# Patient Record
Sex: Male | Born: 1990 | Race: White | Hispanic: No | Marital: Single | Smoking: Never smoker
Health system: Southern US, Community
[De-identification: ages and names within clinical notes are randomized; demographics above are authoritative.]

## PROBLEM LIST (undated history)

## (undated) DIAGNOSIS — J45990 Exercise induced bronchospasm: Secondary | ICD-10-CM

## (undated) DIAGNOSIS — J3501 Chronic tonsillitis: Secondary | ICD-10-CM

## (undated) DIAGNOSIS — M4310 Spondylolisthesis, site unspecified: Secondary | ICD-10-CM

## (undated) HISTORY — PX: TYMPANOSTOMY TUBE PLACEMENT: SHX32

---
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
## 2012-01-24 ENCOUNTER — Ambulatory Visit (INDEPENDENT_AMBULATORY_CARE_PROVIDER_SITE_OTHER): Payer: 59 | Admitting: Physician Assistant

## 2012-01-24 VITALS — BP 104/70 | HR 72 | Temp 97.8°F | Resp 16 | Ht 72.5 in | Wt 155.0 lb

## 2012-01-24 DIAGNOSIS — J069 Acute upper respiratory infection, unspecified: Secondary | ICD-10-CM

## 2012-01-24 DIAGNOSIS — T148XXA Other injury of unspecified body region, initial encounter: Secondary | ICD-10-CM

## 2012-01-24 DIAGNOSIS — IMO0002 Reserved for concepts with insufficient information to code with codable children: Secondary | ICD-10-CM

## 2012-01-24 MED ORDER — GUAIFENESIN ER 1200 MG PO TB12: 1.0000 | ORAL_TABLET | Freq: Two times a day (BID) | ORAL | Status: DC | PRN

## 2012-01-24 MED ORDER — IPRATROPIUM BROMIDE 0.03 % NA SOLN: 2.0000 | Freq: Two times a day (BID) | NASAL | Status: DC

## 2012-01-24 NOTE — Patient Instructions (Addendum)
Get lots of rest and drink at least 64 ounces of water daily. You can expect to see improvement in the next 48-72 hours.  If your symptoms worsen or persist, please return for re-evaluation.  Apply an antibiotic ointment to the abrasion under your arm.

## 2012-01-24 NOTE — Progress Notes (Signed)
  Subjective:    Patient ID: Jacob Bright, male    DOB: 1990-10-18, 21 y.o.   MRN: [id number]  HPI This 21 y.o. male presents for evaluation of congestion, sore throat, "I think due to drainage" and coughing.  Symptoms began about 5 days ago.  No fever, chills, GI symptoms, GU symptoms, unexplained myalgias/arthralgias.  Rash noted in right axilla 3 ago.  Not itchy, but feels burning sensation when raises arm over head.  No new products. He recalls resting his arms on the edge of the pool a few days ago, but does not recall scraping or burning himself at that time.  Review of Systems As above.  Past Medical History  Diagnosis Date  . Asthma     Past Surgical History  Procedure Date  . Nasal septum surgery   . Tympanostomy tube placement     Prior to Admission medications   Medication Sig Start Date End Date Taking? Authorizing Provider  albuterol (PROVENTIL HFA;VENTOLIN HFA) 108 (90 BASE) MCG/ACT inhaler Inhale 2 puffs into the lungs every 6 (six) hours as needed.   Yes Historical Provider, MD    No Known Allergies  History   Social History  . Marital Status: Single    Spouse Name: n/a    Number of Children: 0  . Years of Education: N/A   Occupational History  . Student     [employer]; Accounting   Social History Main Topics  . Smoking status: Never Smoker   . Smokeless tobacco: Current User    Types: Chew   Comment: doesn't use daily  . Alcohol Use: 2.0 - 9.0 oz/week    4-18 drink(s) per week  . Drug Use: No  . Sexually Active: Yes -- Male partner(s)   Other Topics Concern  . 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    Family History  Problem Relation Age of Onset  . Hypertension Father        Objective:   Physical Exam Blood pressure 104/70, pulse 72, temperature 97.8 F (36.6 C), temperature source Oral, resp. rate 16, height 6' 0.5" (1.842 m), weight 155 lb (70.308 kg), SpO2  97.00%. Body mass index is 20.73 kg/(m^2). Well-developed, well nourished WM who is awake, alert and oriented, in NAD. HEENT: [place]/AT, sclera and conjunctiva are clear.  EAC are patent, TMs are normal in appearance. Nasal mucosa is pink and moist. OP is clear. Neck: supple, non-tender, no lymphadenopathy, thyromegaly. Heart: RRR, no murmur Lungs: normal effort, CTA Skin: warm and dry with a crusted abrasion in the right axilla, without evidence of secondary infection.     Assessment & Plan:   1. Acute upper respiratory infections of unspecified site  Guaifenesin (MUCINEX MAXIMUM STRENGTH) 1200 MG TB12, ipratropium (ATROVENT) 0.03 % nasal spray  2. Abrasion  OTC antibiotic ointment   Anticipatory guidance provided.

## 2012-05-05 ENCOUNTER — Ambulatory Visit (INDEPENDENT_AMBULATORY_CARE_PROVIDER_SITE_OTHER): Payer: 59 | Admitting: Internal Medicine

## 2012-05-05 VITALS — BP 127/71 | HR 114 | Temp 98.6°F | Resp 18 | Ht 72.5 in | Wt 161.2 lb

## 2012-05-05 DIAGNOSIS — J039 Acute tonsillitis, unspecified: Secondary | ICD-10-CM

## 2012-05-05 DIAGNOSIS — J029 Acute pharyngitis, unspecified: Secondary | ICD-10-CM

## 2012-05-05 LAB — POCT CBC
Granulocyte percent: 82.6 %G — AB (ref 37–80)
HCT, POC: 48.3 % (ref 43.5–53.7)
Hemoglobin: 15.4 g/dL (ref 14.1–18.1)
Lymph, poc: 1 (ref 0.6–3.4)
MCH, POC: 30.2 pg (ref 27–31.2)
MCHC: 31.9 g/dL (ref 31.8–35.4)
MCV: 94.8 fL (ref 80–97)
MID (cbc): 0.8 (ref 0–0.9)
MPV: 9.5 fL (ref 0–99.8)
POC Granulocyte: 8.8 — AB (ref 2–6.9)
POC LYMPH PERCENT: 9.7 %L — AB (ref 10–50)
POC MID %: 7.7 %M (ref 0–12)
Platelet Count, POC: 237 10*3/uL (ref 142–424)
RBC: 5.1 M/uL (ref 4.69–6.13)
RDW, POC: 12.8 %
WBC: 10.7 10*3/uL — AB (ref 4.6–10.2)

## 2012-05-05 LAB — POCT RAPID STREP A (OFFICE): Rapid Strep A Screen: NEGATIVE

## 2012-05-05 MED ORDER — ACETAMINOPHEN-CODEINE #3 300-30 MG PO TABS: 1.0000 | ORAL_TABLET | Freq: Four times a day (QID) | ORAL | Status: DC | PRN

## 2012-05-05 MED ORDER — AMOXICILLIN 500 MG PO CAPS: 1000.0000 mg | ORAL_CAPSULE | Freq: Two times a day (BID) | ORAL | Status: AC

## 2012-05-05 NOTE — Progress Notes (Signed)
  Subjective:    Patient ID: Jacob Bright, male    DOB: 09-21-1990, 21 y.o.   MRN: [id number]  HPI mark throat pain since last night with swelling History of recurrent tonsillitis 3-5 times a year Rarely strep No history of mono Fever over night couldn't sleep   Review of Systems     Objective:   Physical Exam Vital signs stable Nares clear Tonsils 4+ an ulcerated with exudate 2+ a.c. nodes tender       Results for orders placed in visit on 05/05/12  POCT RAPID STREP A (OFFICE)      Component Value Range   Rapid Strep A Screen Negative  Negative  POCT CBC      Component Value Range   WBC 10.7 (*) 4.6 - 10.2 K/uL   Lymph, poc 1.0  0.6 - 3.4   POC LYMPH PERCENT 9.7 (*) 10 - 50 %L   MID (cbc) 0.8  0 - 0.9   POC MID % 7.7  0 - 12 %M   POC Granulocyte 8.8 (*) 2 - 6.9   Granulocyte percent 82.6 (*) 37 - 80 %G   RBC 5.10  4.69 - 6.13 M/uL   Hemoglobin 15.4  14.1 - 18.1 g/dL   HCT, POC 40.9  81.1 - 53.7 %   MCV 94.8  80 - 97 fL   MCH, POC 30.2  27 - 31.2 pg   MCHC 31.9  31.8 - 35.4 g/dL   RDW, POC 91.4     Platelet Count, POC 237  142 - 424 K/uL   MPV 9.5  0 - 99.8 fL    Assessment & Plan:  Problem #1 acute tonsillitis with history of recurrent problems  Throat culture Meds ordered this encounter  Medications  . amoxicillin (AMOXIL) 500 MG capsule    Sig: Take 2 capsules (1,000 mg total) by mouth 2 (two) times daily.    Dispense:  40 capsule    Refill:  0  . acetaminophen-codeine (TYLENOL #3) 300-30 MG per tablet    Sig: Take 1-2 tablets by mouth every 6 (six) hours as needed for pain.    Dispense:  30 tablet    Refill:  0   Consider tonsillectomy

## 2012-05-07 ENCOUNTER — Encounter: Payer: Self-pay | Admitting: Internal Medicine

## 2012-05-07 LAB — CULTURE, GROUP A STREP

## 2012-10-24 ENCOUNTER — Ambulatory Visit (INDEPENDENT_AMBULATORY_CARE_PROVIDER_SITE_OTHER): Payer: 59 | Admitting: Emergency Medicine

## 2012-10-24 VITALS — BP 136/80 | HR 97 | Temp 97.7°F | Resp 16 | Ht 73.5 in | Wt 171.0 lb

## 2012-10-24 DIAGNOSIS — R21 Rash and other nonspecific skin eruption: Secondary | ICD-10-CM

## 2012-10-24 DIAGNOSIS — J029 Acute pharyngitis, unspecified: Secondary | ICD-10-CM

## 2012-10-24 MED ORDER — PENICILLIN V POTASSIUM 500 MG PO TABS: 500.0000 mg | ORAL_TABLET | Freq: Four times a day (QID) | ORAL | Status: DC

## 2012-10-24 NOTE — Patient Instructions (Signed)
Herpes Simplex Herpes simplex is generally classified as Type 1 or Type 2. Type 1 is generally the type that is responsible for cold sores. Type 2 is generally associated with sexually transmitted diseases. We now know that most of the thoughts on these viruses are inaccurate. We find that HSV1 is also present genitally and HSV2 can be present orally, but this will vary in different locations of the world. Herpes simplex is usually detected by doing a culture. Blood tests are also available for this virus; however, the accuracy is often not as good.  PREPARATION FOR TEST No preparation or fasting is necessary. NORMAL FINDINGS  No virus present  No HSV antigens or antibodies present Ranges for normal findings may vary among different laboratories and hospitals. You should always check with your doctor after having lab work or other tests done to discuss the meaning of your test results and whether your values are considered within normal limits. MEANING OF TEST  Your caregiver will go over the test results with you and discuss the importance and meaning of your results, as well as treatment options and the need for additional tests if necessary. OBTAINING THE TEST RESULTS  It is your responsibility to obtain your test results. Ask the lab or department performing the test when and how you will get your results. Document Released: 06/10/2004 Document Revised: 07/31/2011 Document Reviewed: 04/18/2008 ExitCare Patient Information 2014 [employer].  

## 2012-10-24 NOTE — Progress Notes (Signed)
[facility] [address], [place] [address] [phone]  Date:  10/24/2012   Name:  Jacob Bright   DOB:  September 04, 1990   MRN:  [id number]  PCP:  No primary provider on file.    Chief Complaint: Sore Throat and Rash   History of Present Illness:  Jacob Bright is a 22 y.o. very pleasant male patient who presents with the following:  Ill with nasal congestion and drainage.  Has a mucoid discharge and post nasal drainage and a non productive cough.  No nausea or vomiting.  No stool change.  No fever or chills. No wheezing or shortness of breath. Developed a sore throat.  No sick contacts.   Had intercourse with a new partner yesterday and developed a painful rash on his right inguinal crease yesterday.  No history of STD.  No dysuria or penile lesions. No improvement with over the counter medications or other home remedies. Denies other complaint or health concern today.   There are no active problems to display for this patient.   Past Medical History  Diagnosis Date  . Asthma     Past Surgical History  Procedure Laterality Date  . Nasal septum surgery    . Tympanostomy tube placement      History  Substance Use Topics  . Smoking status: Never Smoker   . Smokeless tobacco: Current User    Types: Chew     Comment: doesn't use daily  . Alcohol Use: 2 - 9 oz/week    4-18 drink(s) per week     Comment: social    Family History  Problem Relation Age of Onset  . Hypertension Father     No Known Allergies  Medication list has been reviewed and updated.  Current Outpatient Prescriptions on File Prior to Visit  Medication Sig Dispense Refill  . albuterol (PROVENTIL HFA;VENTOLIN HFA) 108 (90 BASE) MCG/ACT inhaler Inhale 2 puffs into the lungs every 6 (six) hours as needed.      Marland Kitchen ipratropium (ATROVENT) 0.03 % nasal spray Place 2 sprays into the nose 2 (two) times daily.  30 mL  0  . acetaminophen-codeine (TYLENOL #3) 300-30 MG per tablet Take 1-2  tablets by mouth every 6 (six) hours as needed for pain.  30 tablet  0  . Guaifenesin (MUCINEX MAXIMUM STRENGTH) 1200 MG TB12 Take 1 tablet (1,200 mg total) by mouth every 12 (twelve) hours as needed.  14 tablet  1   No current facility-administered medications on file prior to visit.    Review of Systems:  As per HPI, otherwise negative.    Physical Examination: Filed Vitals:   10/24/12 1225  BP: 136/80  Pulse: 97  Temp: 97.7 F (36.5 C)  Resp: 16   Filed Vitals:   10/24/12 1225  Height: 6' 1.5" (1.867 m)  Weight: 171 lb (77.565 kg)   Body mass index is 22.25 kg/(m^2). Ideal Body Weight: Weight in (lb) to have BMI = 25: 191.7  GEN: WDWN, NAD, Non-toxic, A & O x 3 HEENT: Atraumatic, Normocephalic. Neck supple. No masses, No LAD. Oropharynx erythematous.  Tonsils enlarged. Ears and Nose: No external deformity. CV: RRR, No M/G/R. No JVD. No thrill. No extra heart sounds. PULM: CTA B, no wheezes, crackles, rhonchi. No retractions. No resp. distress. No accessory muscle use. ABD: S, NT, ND, +BS. No rebound. No HSM. EXTR: No c/c/e NEURO Normal gait.  PSYCH: Normally interactive. Conversant. Not depressed or anxious appearing.  Calm  demeanor.  Skin:  Discrete lesions decapitated.  Tender seem vesicular and crusted.  Right groin Genitalia normal male  Assessment and Plan: phayrngitis Pen vk Rash suggestive of herpes Culture  Signed,  Phillips Odor, MD

## 2012-10-28 LAB — HERPES SIMPLEX VIRUS CULTURE: Organism ID, Bacteria: NOT DETECTED

## 2013-02-20 ENCOUNTER — Other Ambulatory Visit: Payer: Self-pay | Admitting: Orthopedic Surgery

## 2013-02-20 DIAGNOSIS — M25571 Pain in right ankle and joints of right foot: Secondary | ICD-10-CM

## 2013-02-25 ENCOUNTER — Other Ambulatory Visit: Payer: 59

## 2013-02-27 ENCOUNTER — Ambulatory Visit
Admission: RE | Admit: 2013-02-27 | Discharge: 2013-02-27 | Disposition: A | Discharge status: Home / Self Care | Payer: 59 | Source: Ambulatory Visit | Attending: Orthopedic Surgery | Admitting: Orthopedic Surgery

## 2013-02-27 DIAGNOSIS — M25571 Pain in right ankle and joints of right foot: Secondary | ICD-10-CM

## 2013-03-13 ENCOUNTER — Encounter (HOSPITAL_COMMUNITY): Payer: Self-pay | Admitting: Pharmacy Technician

## 2013-03-13 ENCOUNTER — Encounter (HOSPITAL_COMMUNITY)
Admission: RE | Admit: 2013-03-13 | Discharge: 2013-03-13 | Disposition: A | Discharge status: Home / Self Care | Payer: 59 | Source: Ambulatory Visit | Attending: Orthopedic Surgery | Admitting: Orthopedic Surgery

## 2013-03-13 ENCOUNTER — Other Ambulatory Visit (HOSPITAL_COMMUNITY): Payer: Self-pay | Admitting: Orthopedic Surgery

## 2013-03-13 ENCOUNTER — Encounter (HOSPITAL_COMMUNITY): Payer: Self-pay

## 2013-03-13 LAB — CBC
HCT: 45.2 % (ref 39.0–52.0)
Hemoglobin: 15.7 g/dL (ref 13.0–17.0)
MCH: 31.8 pg (ref 26.0–34.0)
MCHC: 34.7 g/dL (ref 30.0–36.0)
MCV: 91.5 fL (ref 78.0–100.0)
Platelets: 247 10*3/uL (ref 150–400)
RBC: 4.94 MIL/uL (ref 4.22–5.81)
RDW: 12.3 % (ref 11.5–15.5)
WBC: 5 10*3/uL (ref 4.0–10.5)

## 2013-03-13 LAB — BASIC METABOLIC PANEL
BUN: 16 mg/dL (ref 6–23)
CO2: 29 mEq/L (ref 19–32)
Calcium: 9.8 mg/dL (ref 8.4–10.5)
Chloride: 102 mEq/L (ref 96–112)
Creatinine, Ser: 1.1 mg/dL (ref 0.50–1.35)
GFR calc Af Amer: >90 mL/min (ref >90)
GFR calc non Af Amer: >90 mL/min (ref >90)
Glucose, Bld: 60 mg/dL — ABNORMAL LOW (ref 70–99)
Potassium: 4.1 mEq/L (ref 3.5–5.1)
Sodium: 140 mEq/L (ref 135–145)

## 2013-03-13 MED ORDER — CEFAZOLIN SODIUM-DEXTROSE 2-3 GM-% IV SOLR
2.0000 g | INTRAVENOUS | Status: AC
  Administered 2013-03-14: 2 g via INTRAVENOUS
  Filled 2013-03-13: qty 50

## 2013-03-13 NOTE — Progress Notes (Signed)
Denies having a PCP Denies having a CXR or EKG. Denies having a stress test, echo, or card cath. Pt states he has asthma with sports, but no problems, and asthma is well managed.

## 2013-03-13 NOTE — Pre-Procedure Instructions (Signed)
Jacob Bright  03/13/2013   Your procedure is scheduled on:  03-14-13 @ 1115  Report to [facility]  2 * 3 at 0915 AM.  Call this number if you have problems the morning of surgery: [phone]   Remember:   Do not eat food or drink liquids after midnight.   Take these medicines the morning of surgery with A SIP OF WATER: Pain med if needed, Albuterol if needed   Do not wear jewelry, make-up or nail polish.  Do not wear lotions, powders, or perfumes. You may wear deodorant.  Do not shave 48 hours prior to surgery. Men may shave face and neck.  Do not bring valuables to the hospital.  [facility] is not responsible                  for any belongings or valuables.               Contacts, dentures or bridgework may not be worn into surgery.  Leave suitcase in the car. After surgery it may be brought to your room.  For patients admitted to the hospital, discharge time is determined by your                treatment team.               Patients discharged the day of surgery will not be allowed to drive  home.    Special Instructions: Shower using CHG 2 nights before surgery and the night before surgery.  If you shower the day of surgery use CHG.  Use special wash - you have one bottle of CHG for all showers.  You should use approximately 1/3 of the bottle for each shower.   Please read over the following fact sheets that you were given: Pain Booklet, Coughing and Deep Breathing and Surgical Site Infection Prevention

## 2013-03-14 ENCOUNTER — Encounter (HOSPITAL_COMMUNITY): Payer: Self-pay | Admitting: *Deleted

## 2013-03-14 ENCOUNTER — Encounter (HOSPITAL_COMMUNITY): Payer: 59 | Admitting: Anesthesiology

## 2013-03-14 ENCOUNTER — Encounter (HOSPITAL_COMMUNITY)
Admission: RE | Disposition: A | Discharge status: Home / Self Care | Payer: Self-pay | Source: Ambulatory Visit | Attending: Orthopedic Surgery

## 2013-03-14 ENCOUNTER — Ambulatory Visit (HOSPITAL_COMMUNITY): Payer: 59 | Admitting: Anesthesiology

## 2013-03-14 ENCOUNTER — Ambulatory Visit (HOSPITAL_COMMUNITY)
Admission: RE | Admit: 2013-03-14 | Discharge: 2013-03-14 | Disposition: A | Discharge status: Home / Self Care | Payer: 59 | Source: Ambulatory Visit | Attending: Orthopedic Surgery | Admitting: Orthopedic Surgery

## 2013-03-14 DIAGNOSIS — M25371 Other instability, right ankle: Secondary | ICD-10-CM

## 2013-03-14 DIAGNOSIS — M249 Joint derangement, unspecified: Secondary | ICD-10-CM | POA: Insufficient documentation

## 2013-03-14 DIAGNOSIS — M24176 Other articular cartilage disorders, unspecified foot: Secondary | ICD-10-CM | POA: Insufficient documentation

## 2013-03-14 HISTORY — PX: ANKLE ARTHROSCOPY WITH RECONSTRUCTION: SHX5583

## 2013-03-14 SURGERY — ARTHROSCOPY, ANKLE, WITH RECONSTRUCTION
Anesthesia: General | Site: Ankle | Laterality: Right | Wound class: Clean

## 2013-03-14 MED ORDER — ONDANSETRON HCL 4 MG/2ML IJ SOLN: 4.0000 mg | Freq: Once | INTRAMUSCULAR | Status: DC | PRN

## 2013-03-14 MED ORDER — HYDROCODONE-ACETAMINOPHEN 5-325 MG PO TABS
1.0000 | ORAL_TABLET | Freq: Once | ORAL | Status: AC
  Administered 2013-03-14: 1 via ORAL

## 2013-03-14 MED ORDER — ONDANSETRON HCL 4 MG/2ML IJ SOLN
INTRAMUSCULAR | Status: DC | PRN
  Administered 2013-03-14: 4 mg via INTRAVENOUS

## 2013-03-14 MED ORDER — LIDOCAINE HCL (CARDIAC) 20 MG/ML IV SOLN
INTRAVENOUS | Status: DC | PRN
  Administered 2013-03-14: 80 mg via INTRAVENOUS

## 2013-03-14 MED ORDER — LACTATED RINGERS IV SOLN
INTRAVENOUS | Status: DC | PRN
  Administered 2013-03-14: 11:00:00 via INTRAVENOUS

## 2013-03-14 MED ORDER — HYDROCODONE-ACETAMINOPHEN 5-325 MG PO TABS: 1.0000 | ORAL_TABLET | Freq: Once | ORAL | Status: DC

## 2013-03-14 MED ORDER — GLYCOPYRROLATE 0.2 MG/ML IJ SOLN
INTRAMUSCULAR | Status: DC | PRN
  Administered 2013-03-14: 0.2 mg via INTRAVENOUS

## 2013-03-14 MED ORDER — HYDROMORPHONE HCL PF 1 MG/ML IJ SOLN
0.2500 mg | INTRAMUSCULAR | Status: DC | PRN
  Administered 2013-03-14 (×2): 0.5 mg via INTRAVENOUS

## 2013-03-14 MED ORDER — LACTATED RINGERS IV SOLN
INTRAVENOUS | Status: DC
  Administered 2013-03-14: 50 mL/h via INTRAVENOUS

## 2013-03-14 MED ORDER — FENTANYL CITRATE 0.05 MG/ML IJ SOLN
INTRAMUSCULAR | Status: DC | PRN
  Administered 2013-03-14: 50 ug via INTRAVENOUS

## 2013-03-14 MED ORDER — BUPIVACAINE HCL (PF) 0.25 % IJ SOLN
INTRAMUSCULAR | Status: AC
  Filled 2013-03-14: qty 30

## 2013-03-14 MED ORDER — FENTANYL CITRATE 0.05 MG/ML IJ SOLN
INTRAMUSCULAR | Status: AC
  Administered 2013-03-14: 100 ug
  Filled 2013-03-14: qty 2

## 2013-03-14 MED ORDER — FENTANYL CITRATE 0.05 MG/ML IJ SOLN
INTRAMUSCULAR | Status: AC
  Filled 2013-03-14: qty 2

## 2013-03-14 MED ORDER — 0.9 % SODIUM CHLORIDE (POUR BTL) OPTIME
TOPICAL | Status: DC | PRN
  Administered 2013-03-14: 1000 mL

## 2013-03-14 MED ORDER — HYDROMORPHONE HCL PF 1 MG/ML IJ SOLN
INTRAMUSCULAR | Status: AC
  Filled 2013-03-14: qty 1

## 2013-03-14 MED ORDER — PROPOFOL 10 MG/ML IV BOLUS
INTRAVENOUS | Status: DC | PRN
  Administered 2013-03-14: 200 mg via INTRAVENOUS

## 2013-03-14 MED ORDER — MIDAZOLAM HCL 5 MG/5ML IJ SOLN
INTRAMUSCULAR | Status: DC | PRN
  Administered 2013-03-14: 1 mg via INTRAVENOUS

## 2013-03-14 MED ORDER — HYDROCODONE-ACETAMINOPHEN 5-325 MG PO TABS: 1.0000 | ORAL_TABLET | Freq: Four times a day (QID) | ORAL | Status: DC | PRN

## 2013-03-14 MED ORDER — HYDROCODONE-ACETAMINOPHEN 5-325 MG PO TABS
ORAL_TABLET | ORAL | Status: AC
  Filled 2013-03-14: qty 1

## 2013-03-14 MED ORDER — SODIUM CHLORIDE 0.9 % IR SOLN
Status: DC | PRN
  Administered 2013-03-14 (×2): 3000 mL

## 2013-03-14 SURGICAL SUPPLY — 53 items
BANDAGE GAUZE ELAST BULKY 4 IN (GAUZE/BANDAGES/DRESSINGS) ×2 IMPLANT
BLADE CUDA 5.5 (BLADE) IMPLANT
BLADE GREAT WHITE 4.2 (BLADE) ×2 IMPLANT
BLADE INCISOR PLUS 5.5 (BLADE) IMPLANT
BLADE SURG 15 STRL LF DISP TIS (BLADE) ×1 IMPLANT
BLADE SURG 15 STRL SS (BLADE) ×1
BNDG COHESIVE 6X5 TAN STRL LF (GAUZE/BANDAGES/DRESSINGS) ×2 IMPLANT
BUR OVAL 6.0 (BURR) IMPLANT
COVER SURGICAL LIGHT HANDLE (MISCELLANEOUS) ×2 IMPLANT
DRAPE ARTHROSCOPY W/POUCH 114 (DRAPES) ×2 IMPLANT
DRAPE C-ARM MINI 42X72 WSTRAPS (DRAPES) IMPLANT
DRAPE U-SHAPE 47X51 STRL (DRAPES) ×2 IMPLANT
DRSG EMULSION OIL 3X3 NADH (GAUZE/BANDAGES/DRESSINGS) ×2 IMPLANT
DRSG PAD ABDOMINAL 8X10 ST (GAUZE/BANDAGES/DRESSINGS) ×2 IMPLANT
DURAPREP 26ML APPLICATOR (WOUND CARE) ×2 IMPLANT
GLOVE BIOGEL PI IND STRL 6.5 (GLOVE) ×1 IMPLANT
GLOVE BIOGEL PI IND STRL 9 (GLOVE) ×1 IMPLANT
GLOVE BIOGEL PI INDICATOR 6.5 (GLOVE) ×1
GLOVE BIOGEL PI INDICATOR 9 (GLOVE) ×1
GLOVE SURG ORTHO 9.0 STRL STRW (GLOVE) ×2 IMPLANT
GLOVE SURG SS PI 6.5 STRL IVOR (GLOVE) ×2 IMPLANT
GOWN PREVENTION PLUS XLARGE (GOWN DISPOSABLE) ×2 IMPLANT
GOWN SRG XL XLNG 56XLVL 4 (GOWN DISPOSABLE) ×2 IMPLANT
GOWN STRL NON-REIN XL XLG LVL4 (GOWN DISPOSABLE) ×2
KIT BASIN OR (CUSTOM PROCEDURE TRAY) ×2 IMPLANT
KIT ROOM TURNOVER OR (KITS) ×2 IMPLANT
MANIFOLD NEPTUNE II (INSTRUMENTS) ×2 IMPLANT
NEEDLE 18GX1X1/2 (RX/OR ONLY) (NEEDLE) ×2 IMPLANT
PACK ARTHROSCOPY DSU (CUSTOM PROCEDURE TRAY) ×2 IMPLANT
PAD ARMBOARD 7.5X6 YLW CONV (MISCELLANEOUS) ×4 IMPLANT
PAD CAST 4YDX4 CTTN HI CHSV (CAST SUPPLIES) ×1 IMPLANT
PADDING CAST COTTON 4X4 STRL (CAST SUPPLIES) ×1
PENCIL BUTTON HOLSTER BLD 10FT (ELECTRODE) ×2 IMPLANT
SET ARTHROSCOPY TUBING (MISCELLANEOUS) ×1
SET ARTHROSCOPY TUBING LN (MISCELLANEOUS) ×1 IMPLANT
SPLINT PLASTER CAST XFAST 5X30 (CAST SUPPLIES) ×1 IMPLANT
SPLINT PLASTER XFAST SET 5X30 (CAST SUPPLIES) ×1
SPONGE GAUZE 4X4 12PLY (GAUZE/BANDAGES/DRESSINGS) ×2 IMPLANT
SPONGE LAP 4X18 X RAY DECT (DISPOSABLE) ×2 IMPLANT
SUCTION FRAZIER TIP 10 FR DISP (SUCTIONS) IMPLANT
SUT ETHILON 4 0 PS 2 18 (SUTURE) IMPLANT
SUT FIBERWIRE 2-0 18 17.9 3/8 (SUTURE) ×4
SUT MNCRL AB 3-0 PS2 18 (SUTURE) IMPLANT
SUT VIC AB 2-0 CT1 27 (SUTURE) ×1
SUT VIC AB 2-0 CT1 TAPERPNT 27 (SUTURE) ×1 IMPLANT
SUTURE FIBERWR 2-0 18 17.9 3/8 (SUTURE) ×2 IMPLANT
SYR 20CC LL (SYRINGE) ×2 IMPLANT
TOWEL OR 17X24 6PK STRL BLUE (TOWEL DISPOSABLE) ×2 IMPLANT
TOWEL OR 17X26 10 PK STRL BLUE (TOWEL DISPOSABLE) ×2 IMPLANT
WAND 30 DEG SABER W/CORD (SURGICAL WAND) IMPLANT
WAND 90 DEG TURBOVAC W/CORD (SURGICAL WAND) ×2 IMPLANT
WATER STERILE IRR 1000ML POUR (IV SOLUTION) ×2 IMPLANT
WRAP KNEE MAXI GEL POST OP (GAUZE/BANDAGES/DRESSINGS) IMPLANT

## 2013-03-14 NOTE — Anesthesia Procedure Notes (Signed)
Anesthesia Regional Block:  Popliteal block  Pre-Anesthetic Checklist: ,, timeout performed, Correct Patient, Correct Site, Correct Laterality, Correct Procedure, Correct Position, site marked, Risks and benefits discussed,  Surgical consent,  Pre-op evaluation,  At surgeon's request and post-op pain management  Laterality: Right  Prep: chloraprep and alcohol swabs       Needles:  Injection technique: Single-shot  Needle Type: Stimulator Needle - 80          Additional Needles:  Procedures: nerve stimulator Popliteal block  Nerve Stimulator or Paresthesia:  Response: 0.5 mA, 0.1 ms, 2 cm  Additional Responses:   Narrative:  Start time: 03/14/2013 11:00 AM End time: 03/14/2013 11:05 AM Injection made incrementally with aspirations every 5 mL.  Performed by: Personally  Anesthesiologist: Maren Beach MD  Additional Notes: Pt accepts procedure and risks. 15cc 0.5% Marcaine w/ epi w/o difficulty or discomfort. GES

## 2013-03-14 NOTE — Transfer of Care (Signed)
Immediate Anesthesia Transfer of Care Note  Patient: Jacob Bright  Procedure(s) Performed: Procedure(s) with comments: ANKLE ARTHROSCOPY WITH RECONSTRUCTION (Right) - Right Ankle Allegra Grana Reconstruction with Ankle Arthroscopy  Patient Location: PACU  Anesthesia Type:General  Level of Consciousness: awake, alert  and oriented  Airway & Oxygen Therapy: Patient Spontanous Breathing  Post-op Assessment: Report given to PACU RN  Post vital signs: Reviewed and stable  Complications: No apparent anesthesia complications

## 2013-03-14 NOTE — Op Note (Signed)
OPERATIVE REPORT  DATE OF SURGERY: 03/14/2013  PATIENT:  Jacob Bright,  22 y.o. 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 Patient has failed prolonged conservative therapy is unable to perform his sport and presents at this time for lateral ankle reconstruction and arthroscopic debridement of the ankle. Risks and benefits were discussed with the patient and his father including infection neurovascular injury pain recurrent instability need for additional surgery. Patient's and his father state they understand and wish to proceed at this time. Description of procedure patient was brought to the operating room and underwent a general anesthetic after a regional block. After adequate levels of anesthesia were obtained patient's right lower extremity was prepped using DuraPrep and draped into a sterile field. An anterior incision was made anterior and inferior to the distal fibula on the right. This was carried down to the anterior talofibular ligament this was incised there was a bony loose body that was attached to the origin and this was removed. The necrotic redundant tissue at the anterior talofibular ligament was excised and the ligament was reconstructed with 20 FiberWire. This was then reinforced with the extensor retinaculum. Anterior drawer was now stable. The wound was irrigated and the subcutaneous tissue was closed using 2-0 Vicryl. Attention was then focused on the  ankle. A anteromedial anterolateral portals were made over the ankle the skin was incised blunt dissection was carried down to capsule and blunt trochars were used to insert into the joint. Visualization showed significant synovitis with impingement. Patient underwent extensive debridement of the synovitis. He had good articular cartilage the medial and lateral gutters were cleansed the anterior joint was cleansed. After debridement the electrocautery wand was used for hemostasis. Instruments removed the portals were closed using 2-0 nylon. Anterior drawer test was again checked and this was stable with no anterior drawer. The wounds were covered with Adaptic orthopedic sponges ABDs dressing Kerlix Coban and a sugar tong splint was applied. Patient was extubated taken to the PACU in stable condition plan for discharge to home.  PLAN OF CARE: Discharge to home after PACU  PATIENT DISPOSITION:  PACU - hemodynamically stable.   Nadara Mustard, MD 03/14/2013 12:30 PM

## 2013-03-14 NOTE — Anesthesia Preprocedure Evaluation (Signed)
Anesthesia Evaluation  Patient identified by MRN, date of birth, ID band Patient awake    Reviewed: Allergy & Precautions, H&P , NPO status , Patient's Chart, lab work & pertinent test results  Airway       Dental   Pulmonary asthma ,          Cardiovascular     Neuro/Psych    GI/Hepatic   Endo/Other    Renal/GU      Musculoskeletal   Abdominal   Peds  Hematology   Anesthesia Other Findings   Reproductive/Obstetrics                           Anesthesia Physical Anesthesia Plan  ASA: I  Anesthesia Plan: General   Post-op Pain Management:    Induction: Intravenous  Airway Management Planned: LMA  Additional Equipment:   Intra-op Plan:   Post-operative Plan: Extubation in OR  Informed Consent: I have reviewed the patients History and Physical, chart, labs and discussed the procedure including the risks, benefits and alternatives for the proposed anesthesia with the patient or authorized representative who has indicated his/her understanding and acceptance.     Plan Discussed with: CRNA, Anesthesiologist and Surgeon  Anesthesia Plan Comments:         Anesthesia Quick Evaluation

## 2013-03-14 NOTE — H&P (Signed)
Jacob Bright is an 22 y.o. male.   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 He has a chronic instability at this time has failed prolonged conservative therapy and presents at this time for surgical intervention.  Past Medical History  Diagnosis Date  . Asthma     Past Surgical History  Procedure Laterality Date  . Nasal septum surgery    . Tympanostomy tube placement      Family History  Problem Relation Age of Onset  . Hypertension Father    Social History:  reports that he has never smoked. His smokeless tobacco use includes Chew. He reports that he drinks about 2.0 ounces of alcohol per week. He reports that he does not use illicit drugs.  Allergies: No Known Allergies  No prescriptions prior to admission    Results for orders placed during the hospital encounter of 03/13/13 (from the past 48 hour(s))  CBC     Status: None   Collection Time    03/13/13 11:23 AM      Result Value Range   WBC 5.0  4.0 - 10.5 K/uL   RBC 4.94  4.22 - 5.81 MIL/uL   Hemoglobin 15.7  13.0 - 17.0 g/dL   HCT 16.1  09.6 - 04.5 %   MCV 91.5  78.0 - 100.0 fL   MCH 31.8  26.0 - 34.0 pg   MCHC 34.7  30.0 - 36.0 g/dL   RDW 40.9  81.1 - 91.4 %   Platelets 247  150 - 400 K/uL  BASIC METABOLIC PANEL     Status: Abnormal   Collection Time    03/13/13 11:23 AM      Result Value Range   Sodium 140  135 - 145 mEq/L   Potassium 4.1  3.5 - 5.1 mEq/L   Chloride 102  96 - 112 mEq/L   CO2 29  19 - 32 mEq/L   Glucose, Bld 60 (*) 70 - 99 mg/dL   BUN 16  6 - 23 mg/dL   Creatinine, Ser 7.82  0.50 - 1.35 mg/dL   Calcium 9.8  8.4 - 95.6 mg/dL   GFR calc non Af Amer >90  >90 mL/min   GFR calc Af Amer >90  >90 mL/min   Comment: (NOTE)     The eGFR has been calculated using the CKD EPI equation.     This calculation has not been validated in all  clinical situations.     eGFR's persistently <90 mL/min signify possible Chronic Kidney     Disease.   No results found.  Review of Systems  All other systems reviewed and are negative.    There were no vitals taken for this visit. Physical Exam  On examination patient is a positive anterior drawer the right ankle. He has impingement symptoms with pain palpation of the anterior aspect of the ankle joint. Even with taping of the ankle he has a positive anterior drawer. 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  Plan: Will plan for reconstruction of the anterior talofibular ligament with the glue modified posterior reconstruction. Plan for ankle arthroscopy with debridement. Risks and benefits were discussed including infection neurovascular injury pain DVT need for additional surgery. Patient states he understands and wished to proceed at this time. This was discussed extensively with the patient's father as well.  Jacob Bright V  03/14/2013, 6:13 AM

## 2013-03-17 ENCOUNTER — Encounter (HOSPITAL_COMMUNITY): Payer: Self-pay | Admitting: Orthopedic Surgery

## 2013-04-03 NOTE — Anesthesia Postprocedure Evaluation (Signed)
  Anesthesia Post-op Note  Patient: Jacob Bright  Procedure(s) Performed: Procedure(s) with comments: ANKLE ARTHROSCOPY WITH RECONSTRUCTION (Right) - Right Ankle Gould Modified Brostrom Reconstruction with Ankle Arthroscopy  Patient discharged with no reported anesthetic complications

## 2013-06-12 ENCOUNTER — Encounter: Payer: Self-pay | Admitting: Family Medicine

## 2013-06-12 ENCOUNTER — Ambulatory Visit (INDEPENDENT_AMBULATORY_CARE_PROVIDER_SITE_OTHER): Payer: 59 | Admitting: Family Medicine

## 2013-06-12 VITALS — BP 110/62 | HR 74 | Ht 74.0 in | Wt 162.0 lb

## 2013-06-12 DIAGNOSIS — F909 Attention-deficit hyperactivity disorder, unspecified type: Secondary | ICD-10-CM

## 2013-06-12 DIAGNOSIS — F902 Attention-deficit hyperactivity disorder, combined type: Secondary | ICD-10-CM

## 2013-06-12 MED ORDER — AMPHETAMINE-DEXTROAMPHETAMINE 20 MG PO TABS: 20.0000 mg | ORAL_TABLET | Freq: Two times a day (BID) | ORAL | Status: DC

## 2013-06-12 NOTE — Progress Notes (Signed)
   Subjective:    Patient ID: Jacob Bright, male    DOB: January 22, 1991, 23 y.o.   MRN: [id number]  HPI He is here for consultation concerning ADHD. He has a history of difficulty with being hyper, easily distracted his entire life. He has done well in school until he got into college and is now noting difficulty with keeping focus in getting good grades. His GPA is in the 2.6 range. He was recently evaluated by [facility]. The evaluation was reviewed and does indeed show evidence of ADHD. His mother was also recently diagnosed with ADD and was given Adderall. He did try a 20 mg pill which he states helped him with his focus and allowed him to get his tasks done more efficiently. It lasts roughly 5 hours. He had no residual symptoms.  He does play men's soccer at [employer]. and is Dr. Jonny RuizJohn long a [phone] a refill [phone] the M.D. office 5  Review of Systems     Objective:   Physical Exam  alert and in no distress otherwise not examined       Assessment & Plan:  ADHD (attention deficit hyperactivity disorder), combined type - Plan: amphetamine-dextroamphetamine (ADDERALL) 20 MG tablet  I will give him twice a day dosing. Discussed use of medication for 5 hours versus 10 depending upon his work schedule. Cautioned him about letting other people know he has this medication in terms of giving this or even selling it to anyone. Appropriate paperwork was filled out since he does play soccer at [employer].

## 2013-06-13 ENCOUNTER — Telehealth: Payer: Self-pay | Admitting: Family Medicine

## 2013-06-14 NOTE — Telephone Encounter (Signed)
lm

## 2013-06-27 ENCOUNTER — Ambulatory Visit (INDEPENDENT_AMBULATORY_CARE_PROVIDER_SITE_OTHER): Payer: 59 | Admitting: Physician Assistant

## 2013-06-27 VITALS — BP 118/68 | HR 99 | Temp 98.3°F | Resp 17 | Ht 72.5 in | Wt 163.0 lb

## 2013-06-27 DIAGNOSIS — J039 Acute tonsillitis, unspecified: Secondary | ICD-10-CM

## 2013-06-27 DIAGNOSIS — J029 Acute pharyngitis, unspecified: Secondary | ICD-10-CM

## 2013-06-27 DIAGNOSIS — R509 Fever, unspecified: Secondary | ICD-10-CM

## 2013-06-27 LAB — POCT CBC
Granulocyte percent: 72 %G (ref 37–80)
HCT, POC: 48.2 % (ref 43.5–53.7)
Hemoglobin: 15.3 g/dL (ref 14.1–18.1)
Lymph, poc: 1.4 (ref 0.6–3.4)
MCH, POC: 31.1 pg (ref 27–31.2)
MCHC: 31.7 g/dL — AB (ref 31.8–35.4)
MCV: 97.9 fL — AB (ref 80–97)
MID (cbc): 0.6 (ref 0–0.9)
MPV: 9.8 fL (ref 0–99.8)
POC Granulocyte: 5 (ref 2–6.9)
POC LYMPH PERCENT: 20.1 %L (ref 10–50)
POC MID %: 7.9 %M (ref 0–12)
Platelet Count, POC: 223 10*3/uL (ref 142–424)
RBC: 4.92 M/uL (ref 4.69–6.13)
RDW, POC: 12.9 %
WBC: 7 10*3/uL (ref 4.6–10.2)

## 2013-06-27 LAB — POCT RAPID STREP A (OFFICE): Rapid Strep A Screen: NEGATIVE

## 2013-06-27 MED ORDER — AMOXICILLIN 875 MG PO TABS: 875.0000 mg | ORAL_TABLET | Freq: Two times a day (BID) | ORAL | Status: DC

## 2013-06-27 MED ORDER — ACETAMINOPHEN-CODEINE #3 300-30 MG PO TABS: 1.0000 | ORAL_TABLET | Freq: Four times a day (QID) | ORAL | Status: DC | PRN

## 2013-06-27 NOTE — Progress Notes (Signed)
Subjective:    Patient ID: Jacob Bright H Luz, male    DOB: May 11, 1991, 23 y.o.   MRN: [id number]  HPI Patient ID: Jacob Bright H Fiscus MRN: [id number], DOB: May 11, 1991, 22 y.o. Date of Encounter: 06/27/2013, 7:17 PM  Primary Physician: Carollee HerterLALONDE,JOHN CHARLES, MD  Chief Complaint:  Chief Complaint  Patient presents with  . Sore Throat  . Headache  . previous fever this am per patient    HPI: 23 y.o. male presents with a 1 day history of sore throat, fever, chills, and headache. He complains of mild congestion, sinus pressure, otalgia, and post nasal drip. States he woke up the previous night sweating and with chills. No cough or rhinorrhea. Sightly muffled hearing. His major complaint is a sore throat. No GI complaints. Able to swallow saliva, but hurts to do so. Decreased appetite secondary to sore throat. Roommate with mild URI 1 week ago. Plays soccer for [employer], but it is in the off season. Typically gets strep throat 3 times per year.    PMH: Past Medical History  Diagnosis Date  . Asthma      Home Meds: Prior to Admission medications   Medication Sig Start Date End Date Taking? Authorizing Provider  amphetamine-dextroamphetamine (ADDERALL) 20 MG tablet Take 1 tablet (20 mg total) by mouth 2 (two) times daily. 06/12/13  Yes Ronnald NianJohn C Lalonde, MD    Allergies: No Known Allergies  History   Social History  . Marital Status: Single    Spouse Name: n/a    Number of Children: 0  . Years of Education: N/A   Occupational History  . Student     [employer]; Accounting   Social History Main Topics  . Smoking status: Never Smoker   . Smokeless tobacco: Current User    Types: Chew     Comment: doesn't use daily  . Alcohol Use: 2 - 9 oz/week    4-18 drink(s) per week     Comment: social  . Drug Use: No  . Sexual Activity: Yes    Partners: Female   Other Topics Concern  . 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      Review of Systems  Constitutional: Positive for fever and chills. Negative for appetite change and fatigue.       Woke up sweating.   HENT: Positive for congestion, ear pain, hearing loss, postnasal drip, sinus pressure and sore throat. Negative for rhinorrhea and sneezing.   Respiratory: Negative for cough, shortness of breath and wheezing.   Gastrointestinal: Negative for nausea, vomiting and diarrhea.  Musculoskeletal: Positive for myalgias.  Neurological: Positive for headaches.       Generalized headache.        Objective:   Physical Exam  Physical Exam: Blood pressure 118/68, pulse 99, temperature 98.3 F (36.8 C), temperature source Oral, resp. rate 17, height 6' 0.5" (1.842 m), weight 163 lb (73.936 kg), SpO2 98.00%., Body mass index is 21.79 kg/(m^2). General: Well developed, well nourished, in no acute distress. Head: Normocephalic, atraumatic, eyes without discharge, sclera non-icteric, nares are patent. Bilateral auditory canals clear, TM's are without perforation, pearly grey with reflective cone of light bilaterally. No sinus TTP. Oral cavity moist, dentition normal. Posterior pharynx mildly erythematous. Tonsils 2+ bilaterally. No post nasal drip, peritonsillar abscess, or tonsillar exudate. Uvula midline.  Neck: Supple. No thyromegaly. Full ROM. Lymph nodes: less than 2 cm AC bilaterally. No PC lymph nodes.  Lungs:  Clear bilaterally to auscultation without wheezes, rales, or rhonchi. Breathing is unlabored. Heart: RRR with S1 S2. No murmurs, rubs, or gallops appreciated. Msk:  Strength and tone normal for age. Extremities: No clubbing or cyanosis. No edema. Neuro: Alert and oriented X 3. Moves all extremities spontaneously. CNII-XII grossly in tact. Psych:  Responds to questions appropriately with a normal affect.   Labs: Results for orders placed in visit on 06/27/13  POCT CBC      Result Value Range   WBC 7.0  4.6 - 10.2 K/uL   Lymph, poc 1.4  0.6 - 3.4   POC  LYMPH PERCENT 20.1  10 - 50 %L   MID (cbc) 0.6  0 - 0.9   POC MID % 7.9  0 - 12 %M   POC Granulocyte 5.0  2 - 6.9   Granulocyte percent 72.0  37 - 80 %G   RBC 4.92  4.69 - 6.13 M/uL   Hemoglobin 15.3  14.1 - 18.1 g/dL   HCT, POC 16.1  09.6 - 53.7 %   MCV 97.9 (*) 80 - 97 fL   MCH, POC 31.1  27 - 31.2 pg   MCHC 31.7 (*) 31.8 - 35.4 g/dL   RDW, POC 04.5     Platelet Count, POC 223  142 - 424 K/uL   MPV 9.8  0 - 99.8 fL  POCT RAPID STREP A (OFFICE)      Result Value Range   Rapid Strep A Screen Negative  Negative    Throat culture pending    Assessment & Plan:  23 year old male with tonsillitis  -Amoxicillin 875 mg 1 po bid #20 no RF -Tylenol # 1 po q 6 hours prn ST #30 no RF -New tooth brush -Await throat culture -Rest/fluids -RTC precautions   Eula Listen, MHS, PA-C [facility] [address] [place], [place] [address] [phone] [facility] 06/27/2013 7:18 PM

## 2013-06-30 ENCOUNTER — Telehealth: Payer: Self-pay

## 2013-06-30 LAB — CULTURE, GROUP A STREP: Organism ID, Bacteria: NORMAL

## 2013-06-30 NOTE — Telephone Encounter (Signed)
PT IS RETURNING OUR CALL FOR LAB RESULTS

## 2013-07-01 NOTE — Telephone Encounter (Signed)
See labs 

## 2013-07-14 ENCOUNTER — Telehealth: Payer: Self-pay | Admitting: Family Medicine

## 2013-07-14 DIAGNOSIS — F902 Attention-deficit hyperactivity disorder, combined type: Secondary | ICD-10-CM

## 2013-07-14 MED ORDER — AMPHETAMINE-DEXTROAMPHETAMINE 20 MG PO TABS: 20.0000 mg | ORAL_TABLET | Freq: Two times a day (BID) | ORAL | Status: DC

## 2013-07-14 NOTE — Telephone Encounter (Signed)
He is taking at 20 mg twice per day and doing quite well on this. It lasts roughly 5 hours. He has no side effects on the medication or withdrawal symptoms.

## 2013-07-14 NOTE — Telephone Encounter (Signed)
Pt request refill for Adderall and wants to pick up today.  Pt ph [phone]

## 2013-08-02 ENCOUNTER — Ambulatory Visit (INDEPENDENT_AMBULATORY_CARE_PROVIDER_SITE_OTHER): Payer: 59 | Admitting: Family Medicine

## 2013-08-02 ENCOUNTER — Ambulatory Visit: Payer: 59

## 2013-08-02 VITALS — BP 124/78 | HR 116 | Temp 98.0°F | Resp 20 | Ht 72.5 in | Wt 163.4 lb

## 2013-08-02 DIAGNOSIS — M79641 Pain in right hand: Secondary | ICD-10-CM

## 2013-08-02 DIAGNOSIS — M79643 Pain in unspecified hand: Secondary | ICD-10-CM

## 2013-08-02 DIAGNOSIS — M79609 Pain in unspecified limb: Secondary | ICD-10-CM

## 2013-08-02 DIAGNOSIS — S62329A Displaced fracture of shaft of unspecified metacarpal bone, initial encounter for closed fracture: Secondary | ICD-10-CM

## 2013-08-02 NOTE — Patient Instructions (Signed)
Take ibuprofen for pain  Apply ice this weekend multiple times daily  Check and find out which orthopedic group you need to see, and if you need a referral we can refer you to the hand specialist at that group if they have one.

## 2013-08-02 NOTE — Progress Notes (Signed)
Subjective: Patient was playing soccer and fell on his right hand. He thinks he fell under his body and twisted backward somehow. He is hurting from the middle of the fourth metacarpal down into the finger. He has good range of motion of the of the finger. It is a little swollen on the back of his hand is tender when he presses on the palmar surface of his hand.  Objective: Neurovascular intact. Can flex and extend fingers well. Very tender on the fourth metacarpal. Some swelling.  [facility] reading (PRIMARY) by  Dr. Alwyn RenHopper Fracture of shaft of fourth metacarpal, spiral  Assessment: Hand pain Fracture fourth metacarpal  Plan. Gutter splint He will check with his sports department as to which orthopedic group he should see. I advised him to see an orthopedic hand specialist. He can call back here for a referral.  Theora Gianottihelle Jeffrey PA applied splint

## 2013-09-25 ENCOUNTER — Ambulatory Visit (INDEPENDENT_AMBULATORY_CARE_PROVIDER_SITE_OTHER): Payer: 59 | Admitting: Family Medicine

## 2013-09-25 ENCOUNTER — Encounter: Payer: Self-pay | Admitting: Family Medicine

## 2013-09-25 VITALS — BP 150/92 | HR 80 | Wt 166.0 lb

## 2013-09-25 DIAGNOSIS — F902 Attention-deficit hyperactivity disorder, combined type: Secondary | ICD-10-CM

## 2013-09-25 DIAGNOSIS — F909 Attention-deficit hyperactivity disorder, unspecified type: Secondary | ICD-10-CM

## 2013-09-25 MED ORDER — AMPHETAMINE-DEXTROAMPHETAMINE 20 MG PO TABS: 20.0000 mg | ORAL_TABLET | Freq: Three times a day (TID) | ORAL | Status: DC

## 2013-09-25 MED ORDER — AMPHETAMINE-DEXTROAMPHETAMINE 20 MG PO TABS
20.0000 mg | ORAL_TABLET | Freq: Three times a day (TID) | ORAL | Status: DC
Start: 2013-10-26 — End: 2013-12-16

## 2013-09-25 MED ORDER — AMPHETAMINE-DEXTROAMPHETAMINE 20 MG PO TABS
20.0000 mg | ORAL_TABLET | Freq: Three times a day (TID) | ORAL | Status: DC
Start: 2013-11-25 — End: 2013-12-16

## 2013-09-25 NOTE — Progress Notes (Signed)
   Subjective:    Patient ID: Jacob Bright, male    DOB: 09/22/1990, 23 y.o.   MRN: [id number]  HPI He is here for an ADD check. He notes that he is much more him focused and organized, in fact he got his first 4.0. He is getting about 3-4 hours of benefit out of it. On occasion ,he takes it 3 times per day. He has no withdrawal symptoms. In fact he has actually gained some weight.   Review of Systems     Objective:   Physical Exam Jacob Bright and in no distress otherwise not examined       Assessment & Plan:  ADHD (attention deficit hyperactivity disorder), combined type - Plan: amphetamine-dextroamphetamine (ADDERALL) 20 MG tablet, amphetamine-dextroamphetamine (ADDERALL) 20 MG tablet, amphetamine-dextroamphetamine (ADDERALL) 20 MG tablet  he has a very good feel for using it once, twice or even 3 times a day based on need a regard to school as well as playing soccer.

## 2013-12-10 ENCOUNTER — Telehealth: Payer: Self-pay | Admitting: Family Medicine

## 2013-12-10 DIAGNOSIS — F902 Attention-deficit hyperactivity disorder, combined type: Secondary | ICD-10-CM

## 2013-12-10 NOTE — Telephone Encounter (Signed)
He was seen 5/7.   At that time it appears he got 3 scripts which should take him through 12/26/13. Thus, have him call back for new scripts next week when Dr. Susann GivensLalonde is back in town.

## 2013-12-10 NOTE — Telephone Encounter (Signed)
Pt needs RF Adderall, says he usually gets 3 months at a time.  Please call when ready

## 2013-12-12 NOTE — Telephone Encounter (Signed)
Informed pt JCL will be back on Monday & will send refill request to him.

## 2013-12-15 NOTE — Telephone Encounter (Signed)
He should have enough to last til next month

## 2013-12-16 ENCOUNTER — Telehealth: Payer: Self-pay | Admitting: Family Medicine

## 2013-12-16 MED ORDER — AMPHETAMINE-DEXTROAMPHETAMINE 20 MG PO TABS: 20.0000 mg | ORAL_TABLET | Freq: Three times a day (TID) | ORAL | Status: DC

## 2013-12-16 NOTE — Telephone Encounter (Signed)
Pt does have enough to last til 8/7 but needs to be able to pick up next months Rx's this week (OK post date) just won't be in town to pick up next week

## 2013-12-16 NOTE — Telephone Encounter (Signed)
Pt informed Adderall x 3 ready for pick up

## 2014-02-03 ENCOUNTER — Telehealth: Payer: Self-pay | Admitting: Family Medicine

## 2014-02-16 NOTE — Telephone Encounter (Signed)
fyi

## 2014-02-17 ENCOUNTER — Encounter: Payer: Self-pay | Admitting: Family Medicine

## 2014-02-17 NOTE — Progress Notes (Signed)
   Subjective:    Patient ID: Jacob Bright, male    DOB: 09-01-1990, 23 y.o.   MRN: [id number]  HPI    Review of Systems     Objective:   Physical Exam        Assessment & Plan:  He called on September 15 requesting a referral to Evette CristalKenneth Frazier. I called him back and apparently he has been having difficulty with anxiety, panic attacks. I decided to try and get him in with sports psychology however found out that he was also having other related difficulties requiring more than a sports psychologist felt comfortable with. His father made a recommendation that he see a different therapist. An appointment was set up. I recommended that he allow the therapist to talk to me. He relates anxiety with potentially re\re injuring himself and apparently has had panic attacks with rapid shortness of breath as well as a hyperventilation type symptoms. As yet, I have not talked to the therapist. He apparently decided to take time off from soccer. I encouraged him to return to soccer with the idea that he would be more beneficial for him to continue to play rather than given to his anxieties. Apparently there are questions about his use of drugs. He also apparently made a comment about potentially hurting someone while playing. I will need to followup on this before I can allow him to return to playing soccer.

## 2014-02-17 NOTE — Progress Notes (Unsigned)
   Subjective:    Patient ID: Jacob Bright, male    DOB: Jan 18, 1991, 23 y.o.   MRN: [id number]  HPI    Review of Systems     Objective:   Physical Exam        Assessment & Plan:

## 2014-02-25 ENCOUNTER — Ambulatory Visit (INDEPENDENT_AMBULATORY_CARE_PROVIDER_SITE_OTHER): Payer: 59 | Admitting: Physician Assistant

## 2014-02-25 VITALS — BP 118/72 | HR 92 | Temp 98.1°F | Resp 16 | Ht 72.5 in | Wt 159.2 lb

## 2014-02-25 DIAGNOSIS — J029 Acute pharyngitis, unspecified: Secondary | ICD-10-CM

## 2014-02-25 LAB — POCT RAPID STREP A (OFFICE): Rapid Strep A Screen: NEGATIVE

## 2014-02-25 MED ORDER — GUAIFENESIN ER 1200 MG PO TB12: 1.0000 | ORAL_TABLET | Freq: Two times a day (BID) | ORAL | Status: DC | PRN

## 2014-02-25 MED ORDER — IPRATROPIUM BROMIDE 0.03 % NA SOLN: 2.0000 | Freq: Two times a day (BID) | NASAL | Status: DC

## 2014-02-25 MED ORDER — ACETAMINOPHEN-CODEINE #2 300-15 MG PO TABS: 1.0000 | ORAL_TABLET | ORAL | Status: DC | PRN

## 2014-02-25 NOTE — Progress Notes (Signed)
I have examined this patient along with Ms. Bush, PA-C and agree.  

## 2014-02-25 NOTE — Patient Instructions (Signed)
Drink plenty fluids and get plenty of rest. We will let you know if your throat culture grew out anything. Please return to clinic if your symptoms do not improve.  Pharyngitis Pharyngitis is redness, pain, and swelling (inflammation) of your pharynx.  CAUSES  Pharyngitis is usually caused by infection. Most of the time, these infections are from viruses (viral) and are part of a cold. However, sometimes pharyngitis is caused by bacteria (bacterial). Pharyngitis can also be caused by allergies. Viral pharyngitis may be spread from person to person by coughing, sneezing, and personal items or utensils (cups, forks, spoons, toothbrushes). Bacterial pharyngitis may be spread from person to person by more intimate contact, such as kissing.  SIGNS AND SYMPTOMS  Symptoms of pharyngitis include:   Sore throat.   Tiredness (fatigue).   Low-grade fever.   Headache.  Joint pain and muscle aches.  Skin rashes.  Swollen lymph nodes.  Plaque-like film on throat or tonsils (often seen with bacterial pharyngitis). DIAGNOSIS  Your health care provider will ask you questions about your illness and your symptoms. Your medical history, along with a physical exam, is often all that is needed to diagnose pharyngitis. Sometimes, a rapid strep test is done. Other lab tests may also be done, depending on the suspected cause.  TREATMENT  Viral pharyngitis will usually get better in 3-4 days without the use of medicine. Bacterial pharyngitis is treated with medicines that kill germs (antibiotics).  HOME CARE INSTRUCTIONS   Drink enough water and fluids to keep your urine clear or pale yellow.   Only take over-the-counter or prescription medicines as directed by your health care provider:   If you are prescribed antibiotics, make sure you finish them even if you start to feel better.   Do not take aspirin.   Get lots of rest.   Gargle with 8 oz of salt water ( tsp of salt per 1 qt of water) as  often as every 1-2 hours to soothe your throat.   Throat lozenges (if you are not at risk for choking) or sprays may be used to soothe your throat. SEEK MEDICAL CARE IF:   You have large, tender lumps in your neck.  You have a rash.  You cough up green, yellow-brown, or bloody spit. SEEK IMMEDIATE MEDICAL CARE IF:   Your neck becomes stiff.  You drool or are unable to swallow liquids.  You vomit or are unable to keep medicines or liquids down.  You have severe pain that does not go away with the use of recommended medicines.  You have trouble breathing (not caused by a stuffy nose). MAKE SURE YOU:   Understand these instructions.  Will watch your condition.  Will get help right away if you are not doing well or get worse. Document Released: 05/08/2005 Document Revised: 02/26/2013 Document Reviewed: 01/13/2013 [facility] Patient Information 2015 [employer] [place]. This information is not intended to replace advice given to you by your health care provider. Make sure you discuss any questions you have with your health care provider.

## 2014-02-25 NOTE — Progress Notes (Signed)
   Subjective:    Patient ID: Jacob Bright,Jacob Bright male    DOB: 1990/06/10, 23 y.o.   MRN: [id number]  Sore Throat  Associated symptoms include congestion, coughing and trouble swallowing. Pertinent negatives include no drooling.  Fever  Associated symptoms include congestion, coughing and a sore throat.  Cough Associated symptoms include chills, a fever and a sore throat.    This is a 23 year old male here with sore throat x 2 days. He has felt feverish and has had chills. He also has a cough and nasal congestion. He reports it is extremely painful to swallow solids but is able to swallow liquids. He has been taking ibuprofen 400 mg every 5 hours. He reports he gets tonsillitis approximately three time a year. He wants to get a tonsillectomy however he plays soccer in college and doesn't feel he has time for surgery now.   Review of Systems  Constitutional: Positive for fever and chills.  HENT: Positive for congestion, sore throat and trouble swallowing. Negative for drooling.   Eyes: Negative for discharge.  Respiratory: Positive for cough.   Cardiovascular: Negative.   Gastrointestinal: Negative.       Objective:   Physical Exam  Constitutional: He is oriented to person, place, and time. He appears well-developed and well-nourished. No distress.  HENT:  Head: Normocephalic and atraumatic.  Right Ear: External ear normal.  Left Ear: External ear normal.  Bilateral tympanic membranes clear. Oropharynx erythematous, tonsils are 1+, no exudate present.  Neck: Neck supple.  Cardiovascular: Normal rate, regular rhythm, normal heart sounds and intact distal pulses.   No murmur heard. Pulmonary/Chest: Effort normal and breath sounds normal. No respiratory distress. He has no wheezes. He has no rales.  Lymphadenopathy:    He has no cervical adenopathy.  Neurological: He is alert and oriented to person, place, and time.  Skin: Skin is warm and dry. No rash noted.  Psychiatric: He has a normal  mood and affect. His behavior is normal. Thought content normal.   Results for orders placed in visit on 02/25/14  POCT RAPID STREP A (OFFICE)      Result Value Ref Range   Rapid Strep A Screen Negative  Negative      Assessment & Plan:  1. Acute pharyngitis, unspecified pharyngitis type  Patient has tonsillitis multiple times a year. He is usually prescribed amoxicillin and tylenol #3. His last rapid strep and throat culture here was negative. Today his rapid strep was negative. Discussed with the patient that this is likely a viral URI like last time. He was prescribed Mucinex, atrovent nasal spray and Tylenol #3. Culture is pending. He will be contacted and put on an antibiotic if culture grows bacteria.  - POCT rapid strep A - acetaminophen-codeine (TYLENOL #2) 300-15 MG per tablet; Take 1 tablet by mouth every 4 (four) hours as needed for moderate pain.  Dispense: 30 tablet; Refill: 0 - Culture, Group A Strep - ipratropium (ATROVENT) 0.03 % nasal spray; Place 2 sprays into both nostrils 2 (two) times daily.  Dispense: 30 mL; Refill: 0 - Guaifenesin (MUCINEX MAXIMUM STRENGTH) 1200 MG TB12; Take 1 tablet (1,200 mg total) by mouth every 12 (twelve) hours as needed.  Dispense: 14 tablet; Refill: 1

## 2014-02-27 LAB — CULTURE, GROUP A STREP

## 2014-02-28 ENCOUNTER — Other Ambulatory Visit: Payer: Self-pay | Admitting: Physician Assistant

## 2014-02-28 DIAGNOSIS — J020 Streptococcal pharyngitis: Secondary | ICD-10-CM

## 2014-02-28 MED ORDER — AMOXICILLIN 875 MG PO TABS: 875.0000 mg | ORAL_TABLET | Freq: Two times a day (BID) | ORAL | Status: AC

## 2014-03-01 ENCOUNTER — Telehealth: Payer: Self-pay

## 2014-03-01 DIAGNOSIS — J029 Acute pharyngitis, unspecified: Secondary | ICD-10-CM

## 2014-03-01 MED ORDER — ACETAMINOPHEN-CODEINE #2 300-15 MG PO TABS: 1.0000 | ORAL_TABLET | ORAL | Status: DC | PRN

## 2014-03-01 NOTE — Telephone Encounter (Signed)
Pt's throat is still very sore. Is on his way to the pharm now to p/u his rx. Wants to know if we can send in more Tylenol #3 for him. It is still very difficult for him to swallow and eat. Thanks

## 2014-03-01 NOTE — Telephone Encounter (Signed)
Prescription ready

## 2014-03-01 NOTE — Telephone Encounter (Signed)
Faxed and pt notified.

## 2014-03-10 ENCOUNTER — Institutional Professional Consult (permissible substitution): Payer: 59 | Admitting: Family Medicine

## 2014-03-12 ENCOUNTER — Telehealth: Payer: Self-pay | Admitting: Family Medicine

## 2014-03-12 NOTE — Telephone Encounter (Signed)
He called and asked for a refill on his Adderall. He recently had another drug screen done and it was positive for cocaine as well as amphetamines and codeine. He was apparently given codeine for a dental problem. He plans to potentially get involved in your inpatient or outpatient rehabilitation.

## 2014-03-16 ENCOUNTER — Telehealth: Payer: Self-pay | Admitting: Internal Medicine

## 2014-03-16 NOTE — Telephone Encounter (Signed)
He called stating that he recently finished a court case for DUI. He will lose his license for 1 year. He is also now involved in counseling through [facility] to help deal with the marijuana/cocaine issue. He will no longer be playing soccer at [employer]. He states that he wants now focuses energies on getting his life back together. I stated that I would need to be able to be in contact with his therapist at [facility] especially in regard to drug testing and the fact that he is on an ADD medication. I explained that I would be reluctant to give him this medication if he continues to use street drugs. He is to have his therapist signed a release form and come by here and sign one so we can communicate.

## 2014-03-16 NOTE — Telephone Encounter (Signed)
Pt called to let you know he has set up appts with other doctors and wants you to call him to talk about this

## 2014-03-23 ENCOUNTER — Telehealth: Payer: Self-pay | Admitting: Family Medicine

## 2014-03-23 NOTE — Telephone Encounter (Signed)
Psycholtherapist Jarvis MorganBill McCarthy [phone] called and request that you give him a call .

## 2014-03-24 NOTE — Telephone Encounter (Signed)
I received a call from Jarvis MorganBill McCarthy concerning Roe Coombson. Roe CoombsDon is apparently also going to become involved in [facility] to deal with his drug related issues. I expressed my concern with giving him Adderall under the circumstances. Roe CoombsDon is supposed to fill out a release form so the therapist there and discuss his care with me. Annette StableBill also trying get a release form so all this can be in communication.

## 2014-04-14 ENCOUNTER — Ambulatory Visit (INDEPENDENT_AMBULATORY_CARE_PROVIDER_SITE_OTHER): Payer: 59 | Admitting: Family Medicine

## 2014-04-14 ENCOUNTER — Encounter: Payer: Self-pay | Admitting: Family Medicine

## 2014-04-14 VITALS — BP 100/70 | HR 99 | Wt 157.0 lb

## 2014-04-14 DIAGNOSIS — F902 Attention-deficit hyperactivity disorder, combined type: Secondary | ICD-10-CM

## 2014-04-14 MED ORDER — AMPHETAMINE-DEXTROAMPHETAMINE 20 MG PO TABS: 20.0000 mg | ORAL_TABLET | Freq: Three times a day (TID) | ORAL | Status: DC

## 2014-04-14 NOTE — Progress Notes (Signed)
   Subjective:    Patient ID: Jacob Bright, male    DOB: 1990/12/20, 23 y.o.   MRN: [id number]  HPI He is here for consult. He has had difficulty recently with drugs but seems to have made good progress in dealing with this. He apparently was involved in an outpatient program but according to him they stated that he did not need to be involved in this. He continues in counseling with Jarvis MorganBill McCarthy. He is going to withdraw from some of his classes but others he will try to stay in. He would like to get that on his Adderall medication. He states that it does help him stay focused. He states that while on the medication his grades were in the 80s and prior to this he did have grades in the 2.5 range.   Review of Systems     Objective:   Physical Exam Alert and in no distress otherwise not examined       Assessment & Plan:  ADHD (attention deficit hyperactivity disorder), combined type - Plan: amphetamine-dextroamphetamine (ADDERALL) 20 MG tablet  I will place him back on the Adderall. Discussed continuing in counseling with Jarvis MorganBill McCarthy. I will also discuss this with him. He does seem to be very focused on getting his life in better order. He will now also be paying for his own education and living expenses.

## 2014-06-09 ENCOUNTER — Telehealth: Payer: Self-pay | Admitting: Family Medicine

## 2014-06-09 DIAGNOSIS — F902 Attention-deficit hyperactivity disorder, combined type: Secondary | ICD-10-CM

## 2014-06-09 MED ORDER — AMPHETAMINE-DEXTROAMPHETAMINE 20 MG PO TABS: 20.0000 mg | ORAL_TABLET | Freq: Three times a day (TID) | ORAL | Status: DC

## 2014-06-09 NOTE — Telephone Encounter (Signed)
Pt called and requested refill on adderall. Please call [phone] when ready.

## 2014-07-09 ENCOUNTER — Telehealth: Payer: Self-pay | Admitting: Family Medicine

## 2014-07-09 DIAGNOSIS — F902 Attention-deficit hyperactivity disorder, combined type: Secondary | ICD-10-CM

## 2014-07-09 MED ORDER — AMPHETAMINE-DEXTROAMPHETAMINE 20 MG PO TABS: 20.0000 mg | ORAL_TABLET | Freq: Three times a day (TID) | ORAL | Status: DC

## 2014-07-09 NOTE — Telephone Encounter (Signed)
Adderall renewed.

## 2014-07-10 ENCOUNTER — Telehealth: Payer: Self-pay | Admitting: Family Medicine

## 2014-07-13 NOTE — Telephone Encounter (Signed)
P.A. Approved Amphetamine/Dextroamphetamine til 07/11/15, pt informed, faxed pharmacy

## 2014-08-11 ENCOUNTER — Ambulatory Visit (INDEPENDENT_AMBULATORY_CARE_PROVIDER_SITE_OTHER): Payer: 59 | Admitting: Sports Medicine

## 2014-08-11 VITALS — BP 132/92 | HR 112 | Temp 98.1°F | Resp 20 | Ht 72.25 in | Wt 168.4 lb

## 2014-08-11 DIAGNOSIS — M545 Low back pain: Secondary | ICD-10-CM

## 2014-08-11 DIAGNOSIS — M5459 Other low back pain: Secondary | ICD-10-CM

## 2014-08-11 MED ORDER — PREDNISONE 50 MG PO TABS: 50.0000 mg | ORAL_TABLET | Freq: Every day | ORAL | Status: DC

## 2014-08-11 MED ORDER — CYCLOBENZAPRINE HCL 10 MG PO TABS: 10.0000 mg | ORAL_TABLET | Freq: Three times a day (TID) | ORAL | Status: DC | PRN

## 2014-08-11 MED ORDER — MELOXICAM 15 MG PO TABS: 15.0000 mg | ORAL_TABLET | Freq: Every day | ORAL | Status: DC

## 2014-08-11 NOTE — Progress Notes (Signed)
  Jacob Bright - 24 y.o. male MRN [id number]  Date of birth: January 28, 1991  SUBJECTIVE: Chief Complaint  Patient presents with  . Back Pain    Hurt Back lifting stone pallettes today    HPI:  Acute onset of left low back pain back pain last Thursday  Improve slowly over the weekend but then worsened again today while at work  Left-sided paraspinal pain, no midline pain, nonradiating.  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     ROS:  denies nausea, vomiting, changes in bowel or bladder No numbness or tingling in bilateral lower extremities Otherwise per history of present illness    HISTORY:  Past Medical, Surgical, Social, and Family History reviewed & updated per EMR.  Pertinent Historical Findings include:  reports that he has never smoked. His smokeless tobacco use includes Chew.  ADHD, followed by Dr. Susann GivensLalonde Social history: Was on [employer] team however is taking time away from school right now due to prior substance abuse issues. Patient was not as forthcoming with this during the visit but upon further chart review was noted these had polysubstance misuse/abuse as recently been in counseling for this.  OBJECTIVE:  VS:   HT:6' 0.25" (183.5 cm)   WT:168 lb 6 oz (76.374 kg)  BMI:22.7          BP:(!) 132/92 mmHg  HR:(!) 112bpm  TEMP:98.1 F (36.7 C)(Oral)  RESP:98 %  Physical Exam  Young adult Caucasian male, no acute distress he is alert and appropriately interactive. BACK: Guarding, bilateral paraspinal spinal muscle spasms with erect spine. Left greater than right muscle spasm and tenderness. TTP over left QL. Bilateral negative straight leg raise. Sensation and strength and L2-S1 distribution packed. Lower extremity Reflexes 1+/4 diffusely. Limited  lumbar rotation, sidebending and flexion extension. ASIS compression test positive on left.  ASSESSMENT: 1. Acute mechanical low back pain with duration of less than six weeks    history of substance abuse, ADHD  PLAN: See problem based charting & AVS for additional documentation.  Anti-inflammatory, steroid dose pack and Flexeril.  Avoid use of narcotics  Home exercises program recommended  Patient will try to follow-up with [facility] as he is established there.  Recommend follow-up with Dr. Susann GivensLalonde for routine health maintenance and monitoring for ADHD/substance abuse. > No Follow-up on file.

## 2014-08-11 NOTE — Progress Notes (Deleted)
  Jacob Bright - 24 y.o. male MRN [id number]  Date of birth: 11/05/1990  SUBJECTIVE: Chief Complaint  Patient presents with  . Back Pain    Hurt Back lifting stone pallettes today    HPI:  {01}     ROS: {02}   HISTORY:  Past Medical, Surgical, Social, and Family History reviewed & updated per EMR.  Pertinent Historical Findings include:  reports that he has never smoked. His smokeless tobacco use includes Chew. {03}   Historical Data Reviewed: {04}   OBJECTIVE:  VS:   HT:6' 0.25" (183.5 cm)   WT:168 lb 6 oz (76.374 kg)  BMI:22.7          BP:(!) 132/92 mmHg  HR:(!) 112bpm  TEMP:98.1 F (36.7 C)(Oral)  RESP:98 %  Physical Exam   DATA OBTAINED DURING VISIT: Results for orders placed or performed in visit on 02/25/14  Culture, Group A Strep  Result Value Ref Range   Organism ID, Bacteria STREPTOCOCCUS BETA HEMOLYTIC NOT GROUP A   POCT rapid strep A  Result Value Ref Range   Rapid Strep A Screen Negative Negative    {08}  ASSESSMENT: No diagnosis found.  {10}  PLAN: See problem based charting & AVS for additional documentation.  {07} > No Follow-up on file.   No orders of the defined types were placed in this encounter.    No orders of the defined types were placed in this encounter.

## 2014-08-11 NOTE — Patient Instructions (Signed)
Try taking the steroid dose pack and start the meloxicam once done Flexeril as needed Heat, ice and gentle ROM If you have any problems getting in to [name] please let me know. Tell Jacob Bright you saw Dr. Berline Bright  Back Pain, Adult Low back pain is very common. About 1 in 5 people have back pain.The cause of low back pain is rarely dangerous. The pain often gets better over time.About half of people with a sudden onset of back pain feel better in just 2 weeks. About 8 in 10 people feel better by 6 weeks.  CAUSES Some common causes of back pain include:  Strain of the muscles or ligaments supporting the spine.  Wear and tear (degeneration) of the spinal discs.  Arthritis.  Direct injury to the back. DIAGNOSIS Most of the time, the direct cause of low back pain is not known.However, back pain can be treated effectively even when the exact cause of the pain is unknown.Answering your caregiver's questions about your overall health and symptoms is one of the most accurate ways to make sure the cause of your pain is not dangerous. If your caregiver needs more information, he or she may order lab work or imaging tests (X-rays or MRIs).However, even if imaging tests show changes in your back, this usually does not require surgery. HOME CARE INSTRUCTIONS For many people, back pain returns.Since low back pain is rarely dangerous, it is often a condition that people can learn to [facility] their own.   Remain active. It is stressful on the back to sit or stand in one place. Do not sit, drive, or stand in one place for more than 30 minutes at a time. Take short walks on level surfaces as soon as pain allows.Try to increase the length of time you walk each day.  Do not stay in bed.Resting more than 1 or 2 days can delay your recovery.  Do not avoid exercise or work.Your body is made to move.It is not dangerous to be active, even though your back may hurt.Your back will likely heal faster  if you return to being active before your pain is gone.  Pay attention to your body when you bend and lift. Many people have less discomfortwhen lifting if they bend their knees, keep the load close to their bodies,and avoid twisting. Often, the most comfortable positions are those that put less stress on your recovering back.  Find a comfortable position to sleep. Use a firm mattress and lie on your side with your knees slightly bent. If you lie on your back, put a pillow under your knees.  Only take over-the-counter or prescription medicines as directed by your caregiver. Over-the-counter medicines to reduce pain and inflammation are often the most helpful.Your caregiver may prescribe muscle relaxant drugs.These medicines help dull your pain so you can more quickly return to your normal activities and healthy exercise.  Put ice on the injured area.  Put ice in a plastic bag.  Place a towel between your skin and the bag.  Leave the ice on for 15-20 minutes, 03-04 times a day for the first 2 to 3 days. After that, ice and heat may be alternated to reduce pain and spasms.  Ask your caregiver about trying back exercises and gentle massage. This may be of some benefit.  Avoid feeling anxious or stressed.Stress increases muscle tension and can worsen back pain.It is important to recognize when you are anxious or stressed and learn ways to manage it.Exercise is a  great option. SEEK MEDICAL CARE IF:  You have pain that is not relieved with rest or medicine.  You have pain that does not improve in 1 week.  You have new symptoms.  You are generally not feeling well. SEEK IMMEDIATE MEDICAL CARE IF:   You have pain that radiates from your back into your legs.  You develop new bowel or bladder control problems.  You have unusual weakness or numbness in your arms or legs.  You develop nausea or vomiting.  You develop abdominal pain.  You feel faint. Document Released: 05/08/2005  Document Revised: 11/07/2011 Document Reviewed: 09/09/2013 Jacob Bright Patient Information 2015 Jacob Bright. This information is not intended to replace advice given to you by your health care provider. Make sure you discuss any questions you have with your health care provider.

## 2014-08-13 DIAGNOSIS — M5459 Other low back pain: Secondary | ICD-10-CM | POA: Insufficient documentation

## 2014-08-13 DIAGNOSIS — M545 Low back pain: Secondary | ICD-10-CM | POA: Insufficient documentation

## 2014-09-01 ENCOUNTER — Ambulatory Visit (INDEPENDENT_AMBULATORY_CARE_PROVIDER_SITE_OTHER): Payer: 59 | Admitting: Family Medicine

## 2014-09-01 ENCOUNTER — Encounter: Payer: Self-pay | Admitting: Family Medicine

## 2014-09-01 VITALS — BP 112/80 | HR 106 | Wt 176.0 lb

## 2014-09-01 DIAGNOSIS — S39012D Strain of muscle, fascia and tendon of lower back, subsequent encounter: Secondary | ICD-10-CM

## 2014-09-01 MED ORDER — HYDROCODONE-ACETAMINOPHEN 5-325 MG PO TABS: 1.0000 | ORAL_TABLET | Freq: Four times a day (QID) | ORAL | Status: DC | PRN

## 2014-09-01 NOTE — Progress Notes (Signed)
   Subjective:    Patient ID: Jacob Bright, male    DOB: [birth year], 24 y.o.   MRN: [id number]  HPI He is here with a 3 week history of non radiating left low back pain that began after an injury that occurred while lifting a heavy palette of stones. States the pain is sharp and worse with movement. Denies numbness, tingling, and weakness to lower extremities, bowel or bladder dysfunction.  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 He went to an Urgent Care following his back injury and was prescribed Prednisone, Meloxicam, and Flexeril. He reports the pain is not any better and he has not been able to work the past 3 days. He has been taking Vicodin that he had left over from a previous injury and states this is the only medication that gives him relief. He states he has seen [facility] in past for a hip issue and is planning on following up with them.   Review of Systems     Objective:   Physical Exam Alert and in no distress. Left sided paraspinal tenderness and midline tenderness. Back full ROM, pain with extension, lateral bending, and lumbar rotation. Bilateral lower extremities normal sensation, strength, DTRs lower extremities normal, negative straight leg raise.        Assessment & Plan:  Back strain, subsequent encounter  Recommend using heat 20 minutes 3 times per day and stretching exercises such as knee to chest and take Norco as needed for back pain. Discussed using good posture and body mechanics when lifting. Follow up with [facility] as scheduled.  I explained that I didn't think his present back issue was related to previous difficulties with his hip. He would get a two-week break in the near future which I think will help with his mechanical low back pain.

## 2014-09-01 NOTE — Patient Instructions (Signed)
Heat for 20 minutes 3 times per day and stretching after that. Follow-up with orthopedics.

## 2014-09-07 ENCOUNTER — Telehealth: Payer: Self-pay | Admitting: Internal Medicine

## 2014-09-07 MED ORDER — HYDROCODONE-ACETAMINOPHEN 7.5-325 MG PO TABS: ORAL_TABLET | ORAL | Status: DC

## 2014-09-07 NOTE — Telephone Encounter (Signed)
Pt would like you to call him about his medicine for his back. He wants to talk to you about what is working best for him

## 2014-09-07 NOTE — Telephone Encounter (Signed)
He states that he is roughly 25% better. He is using 2 Norco twice per day to get through the day and also using meloxicam and Flexeril at night. I will switch him to the Norco 7.5. Explained to him that I would like him to use less and less of the medication.

## 2014-09-20 DIAGNOSIS — J3501 Chronic tonsillitis: Secondary | ICD-10-CM

## 2014-09-20 HISTORY — DX: Chronic tonsillitis: J35.01

## 2014-09-21 ENCOUNTER — Telehealth: Payer: Self-pay | Admitting: Family Medicine

## 2014-09-21 DIAGNOSIS — F902 Attention-deficit hyperactivity disorder, combined type: Secondary | ICD-10-CM

## 2014-09-21 MED ORDER — AMPHETAMINE-DEXTROAMPHETAMINE 20 MG PO TABS: 20.0000 mg | ORAL_TABLET | Freq: Three times a day (TID) | ORAL | Status: DC

## 2014-09-21 NOTE — Telephone Encounter (Signed)
Pt called for refill of adderall. Please call [phone] when ready.

## 2014-09-21 NOTE — Telephone Encounter (Signed)
Advised pt Rx Adderall ready for pick up

## 2014-09-22 ENCOUNTER — Telehealth: Payer: Self-pay | Admitting: Family Medicine

## 2014-09-22 MED ORDER — HYDROCODONE-ACETAMINOPHEN 7.5-325 MG PO TABS: ORAL_TABLET | ORAL | Status: DC

## 2014-09-22 NOTE — Telephone Encounter (Signed)
Pt asked that you call him in regards to a medication

## 2014-09-22 NOTE — Telephone Encounter (Signed)
He still needs a little bit of codeine to help get through the workday doing a lot of heavy labor. I will give him 20 more but warned him that I would not be able to continue this.

## 2014-09-29 ENCOUNTER — Ambulatory Visit (INDEPENDENT_AMBULATORY_CARE_PROVIDER_SITE_OTHER): Payer: 59 | Admitting: Emergency Medicine

## 2014-09-29 VITALS — BP 130/86 | HR 91 | Temp 97.5°F | Resp 15 | Ht 72.0 in | Wt 167.0 lb

## 2014-09-29 DIAGNOSIS — J0140 Acute pansinusitis, unspecified: Secondary | ICD-10-CM

## 2014-09-29 MED ORDER — PENICILLIN V POTASSIUM 500 MG PO TABS: 500.0000 mg | ORAL_TABLET | Freq: Four times a day (QID) | ORAL | Status: DC

## 2014-09-29 MED ORDER — PSEUDOEPHEDRINE-GUAIFENESIN ER 60-600 MG PO TB12: 1.0000 | ORAL_TABLET | Freq: Two times a day (BID) | ORAL | Status: DC

## 2014-09-29 MED ORDER — AMOXICILLIN-POT CLAVULANATE 875-125 MG PO TABS: 1.0000 | ORAL_TABLET | Freq: Two times a day (BID) | ORAL | Status: DC

## 2014-09-29 NOTE — Progress Notes (Signed)
[facility] [address], [place] [address] [phone]  Date:  09/29/2014   Name:  Jacob Bright Schommer   DOB:  April 23, 1991   MRN:  [id number]  PCP:  Carollee HerterLALONDE,JOHN CHARLES, MD    Chief Complaint: Sore Throat and Cough   History of Present Illness:  Jacob Bright Prew is a 24 y.o. very pleasant male patient who presents with the following:  Ill for several days with sore throat and dysphagia History of 5 prior infections past 12 months Scheduled for T&A Fever Malaise and fatigue. Nasal congestion and purulent drainage Non productive cough No wheezing or shortness of breath Scant hemoptysis Denies other complaint or health concern today.   Patient Active Problem List   Diagnosis Date Noted  . Acute mechanical low back pain with duration of less than six weeks 08/13/2014  . ADHD (attention deficit hyperactivity disorder), combined type 06/12/2013    Past Medical History  Diagnosis Date  . Asthma     Past Surgical History  Procedure Laterality Date  . Nasal septum surgery    . Tympanostomy tube placement    . Ankle arthroscopy with reconstruction Right 03/14/2013    Procedure: ANKLE ARTHROSCOPY WITH RECONSTRUCTION;  Surgeon: Nadara MustardMarcus V Duda, MD;  Location: [facility] OR;  Service: Orthopedics;  Laterality: Right;  Right Ankle Allegra GranaGould Modified Brostrom Reconstruction with Ankle Arthroscopy    History  Substance Use Topics  . Smoking status: Never Smoker   . Smokeless tobacco: Current User    Types: Chew     Comment: doesn't use daily  . Alcohol Use: 2.0 - 9.0 oz/week    4-18 drink(s) per week     Comment: social    Family History  Problem Relation Age of Onset  . Hypertension Father     No Known Allergies  Medication list has been reviewed and updated.  Current Outpatient Prescriptions on File Prior to Visit  Medication Sig Dispense Refill  . amphetamine-dextroamphetamine (ADDERALL) 20 MG tablet Take 1 tablet (20 mg total) by mouth 3 (three) times daily.  90 tablet 0   No current facility-administered medications on file prior to visit.    Review of Systems:  Review of Systems  Constitutional: Negative for fever, chills and fatigue.  HENT: Negative for congestion, ear pain, hearing loss, postnasal drip, rhinorrhea and sinus pressure.   Eyes: Negative for discharge and redness.  Respiratory: Negative for cough, shortness of breath and wheezing.   Cardiovascular: Negative for chest pain and leg swelling.  Gastrointestinal: Negative for nausea, vomiting, abdominal pain, constipation and blood in stool.  Genitourinary: Negative for dysuria, urgency and frequency.  Musculoskeletal: Negative for neck stiffness.  Skin: Negative for rash.  Neurological: Negative for seizures, weakness and headaches.   Physical Examination: Filed Vitals:   09/29/14 0929  BP: 130/86  Pulse: 91  Temp: 97.5 F (36.4 C)  Resp: 15   Filed Vitals:   09/29/14 0929  Height: 6' (1.829 m)  Weight: 167 lb (75.751 kg)   Body mass index is 22.64 kg/(m^2). Ideal Body Weight: Weight in (lb) to have BMI = 25: 183.9  GEN: WDWN, NAD, Non-toxic, A & O x 3 HEENT: Atraumatic, Normocephalic. Neck supple. No masses, No LAD. Ears and Nose: No external deformity. CV: RRR, No M/G/R. No JVD. No thrill. No extra heart sounds. PULM: CTA B, no wheezes, crackles, rhonchi. No retractions. No resp. distress. No accessory muscle use. ABD: S, NT, ND, +BS. No rebound. No HSM. EXTR: No c/c/e NEURO  Normal gait.  PSYCH: Normally interactive. Conversant. Not depressed or anxious appearing.  Calm demeanor.    Assessment and Plan: Acute sinusitis augmentin mucinex   Signed Phillips OdorJeffery English Tomer, MD

## 2014-10-02 ENCOUNTER — Telehealth: Payer: Self-pay

## 2014-10-02 NOTE — Telephone Encounter (Signed)
Pt has a medical history of tonsillitis; he gets infections every two months. He saw Dr. Dareen PianoAnderson 09/29/14 who diagnosed him with a sinus infection. However, the pt says he is noticing white spots on his tonsils and he is coughing up blood. The antibiotic that was prescribed for a sinus infection is not doing him any good, therefore he would like a different Rx. He also has not been able to eat for the past two days because his throat is so sore. He would like an Rx for the pain, please.

## 2014-10-02 NOTE — Telephone Encounter (Signed)
Pt called again. Please see last phone message.

## 2014-10-03 NOTE — Telephone Encounter (Signed)
Looks like pt is due to get his tonsils out on 6/6. Do we need him to RTC for his symptoms?

## 2014-10-03 NOTE — Telephone Encounter (Signed)
He wants us to know he is usually prescribed amoxicillin and tylenol #3. He feels that he has been here enough that we should know what this is and he doesn't have a way to come back in. I told him it is not in our medical practice to prescribe abx without evaluating first. Pt wanted me to let this be known anyway.

## 2014-10-03 NOTE — Telephone Encounter (Signed)
If patient is having new or worsening sx, he should come back in. I do not see any indication in Dr. Ewell PoeAnderson's note that would indicate tonsillitis.

## 2014-10-20 ENCOUNTER — Encounter (HOSPITAL_BASED_OUTPATIENT_CLINIC_OR_DEPARTMENT_OTHER): Payer: Self-pay | Admitting: *Deleted

## 2014-10-26 ENCOUNTER — Ambulatory Visit (HOSPITAL_BASED_OUTPATIENT_CLINIC_OR_DEPARTMENT_OTHER): Payer: 59 | Admitting: Anesthesiology

## 2014-10-26 ENCOUNTER — Ambulatory Visit (HOSPITAL_BASED_OUTPATIENT_CLINIC_OR_DEPARTMENT_OTHER)
Admission: RE | Admit: 2014-10-26 | Discharge: 2014-10-26 | Disposition: A | Discharge status: Home / Self Care | Payer: 59 | Source: Ambulatory Visit | Attending: Otolaryngology | Admitting: Otolaryngology

## 2014-10-26 ENCOUNTER — Encounter (HOSPITAL_BASED_OUTPATIENT_CLINIC_OR_DEPARTMENT_OTHER)
Admission: RE | Disposition: A | Discharge status: Home / Self Care | Payer: Self-pay | Source: Ambulatory Visit | Attending: Otolaryngology

## 2014-10-26 ENCOUNTER — Encounter (HOSPITAL_BASED_OUTPATIENT_CLINIC_OR_DEPARTMENT_OTHER): Payer: Self-pay | Admitting: *Deleted

## 2014-10-26 DIAGNOSIS — J3501 Chronic tonsillitis: Secondary | ICD-10-CM | POA: Diagnosis present

## 2014-10-26 DIAGNOSIS — Z885 Allergy status to narcotic agent status: Secondary | ICD-10-CM | POA: Insufficient documentation

## 2014-10-26 DIAGNOSIS — F17220 Nicotine dependence, chewing tobacco, uncomplicated: Secondary | ICD-10-CM | POA: Diagnosis not present

## 2014-10-26 DIAGNOSIS — J45990 Exercise induced bronchospasm: Secondary | ICD-10-CM | POA: Insufficient documentation

## 2014-10-26 HISTORY — DX: Exercise induced bronchospasm: J45.990

## 2014-10-26 HISTORY — DX: Spondylolisthesis, site unspecified: M43.10

## 2014-10-26 HISTORY — DX: Chronic tonsillitis: J35.01

## 2014-10-26 HISTORY — PX: TONSILLECTOMY: SHX5217

## 2014-10-26 LAB — POCT HEMOGLOBIN-HEMACUE: Hemoglobin: 17.1 g/dL — ABNORMAL HIGH (ref 13.0–17.0)

## 2014-10-26 SURGERY — TONSILLECTOMY
Anesthesia: General

## 2014-10-26 MED ORDER — LACTATED RINGERS IV SOLN
INTRAVENOUS | Status: DC
  Administered 2014-10-26: 10:00:00 via INTRAVENOUS

## 2014-10-26 MED ORDER — BACITRACIN ZINC 500 UNIT/GM EX OINT
TOPICAL_OINTMENT | CUTANEOUS | Status: AC
  Filled 2014-10-26: qty 0.9

## 2014-10-26 MED ORDER — LIDOCAINE HCL (CARDIAC) 20 MG/ML IV SOLN
INTRAVENOUS | Status: DC | PRN
Start: 2014-10-26 — End: 2014-10-26
  Administered 2014-10-26: 50 mg via INTRAVENOUS

## 2014-10-26 MED ORDER — MIDAZOLAM HCL 2 MG/2ML IJ SOLN
INTRAMUSCULAR | Status: AC
  Filled 2014-10-26: qty 2

## 2014-10-26 MED ORDER — PROMETHAZINE HCL 25 MG PO TABS: 25.0000 mg | ORAL_TABLET | Freq: Four times a day (QID) | ORAL | Status: DC | PRN

## 2014-10-26 MED ORDER — FENTANYL CITRATE (PF) 100 MCG/2ML IJ SOLN
INTRAMUSCULAR | Status: AC
  Filled 2014-10-26: qty 6

## 2014-10-26 MED ORDER — KCL IN DEXTROSE-NACL 20-5-0.45 MEQ/L-%-% IV SOLN
INTRAVENOUS | Status: DC
  Administered 2014-10-26: 13:00:00 via INTRAVENOUS
  Filled 2014-10-26: qty 1000

## 2014-10-26 MED ORDER — HYDROMORPHONE HCL 1 MG/ML IJ SOLN
INTRAMUSCULAR | Status: AC
  Filled 2014-10-26: qty 1

## 2014-10-26 MED ORDER — OXYCODONE HCL 5 MG PO TABS: 5.0000 mg | ORAL_TABLET | Freq: Once | ORAL | Status: DC | PRN

## 2014-10-26 MED ORDER — 0.9 % SODIUM CHLORIDE (POUR BTL) OPTIME
TOPICAL | Status: DC | PRN
  Administered 2014-10-26: 100 mL

## 2014-10-26 MED ORDER — GLYCOPYRROLATE 0.2 MG/ML IJ SOLN: 0.2000 mg | Freq: Once | INTRAMUSCULAR | Status: DC | PRN

## 2014-10-26 MED ORDER — MEPERIDINE HCL 25 MG/ML IJ SOLN: 6.2500 mg | INTRAMUSCULAR | Status: DC | PRN

## 2014-10-26 MED ORDER — MORPHINE SULFATE 2 MG/ML IJ SOLN
INTRAMUSCULAR | Status: AC
  Filled 2014-10-26: qty 1

## 2014-10-26 MED ORDER — MIDAZOLAM HCL 2 MG/2ML IJ SOLN
1.0000 mg | INTRAMUSCULAR | Status: DC | PRN
  Administered 2014-10-26: 2 mg via INTRAVENOUS

## 2014-10-26 MED ORDER — FENTANYL CITRATE (PF) 100 MCG/2ML IJ SOLN
INTRAMUSCULAR | Status: DC | PRN
  Administered 2014-10-26: 100 ug via INTRAVENOUS
  Administered 2014-10-26 (×2): 50 ug via INTRAVENOUS

## 2014-10-26 MED ORDER — SUCCINYLCHOLINE CHLORIDE 20 MG/ML IJ SOLN
INTRAMUSCULAR | Status: DC | PRN
  Administered 2014-10-26: 100 mg via INTRAVENOUS

## 2014-10-26 MED ORDER — HYDROCODONE-ACETAMINOPHEN 7.5-325 MG/15ML PO SOLN
10.0000 mL | ORAL | Status: DC | PRN
  Administered 2014-10-26: 15 mL via ORAL
  Filled 2014-10-26: qty 15

## 2014-10-26 MED ORDER — HYDROMORPHONE HCL 1 MG/ML IJ SOLN
0.2500 mg | INTRAMUSCULAR | Status: DC | PRN
  Administered 2014-10-26 (×4): 0.5 mg via INTRAVENOUS

## 2014-10-26 MED ORDER — FENTANYL CITRATE (PF) 100 MCG/2ML IJ SOLN: 50.0000 ug | INTRAMUSCULAR | Status: DC | PRN

## 2014-10-26 MED ORDER — ONDANSETRON HCL 4 MG/2ML IJ SOLN
INTRAMUSCULAR | Status: DC | PRN
  Administered 2014-10-26: 4 mg via INTRAVENOUS

## 2014-10-26 MED ORDER — MORPHINE SULFATE 2 MG/ML IJ SOLN: 2.0000 mg | INTRAMUSCULAR | Status: DC | PRN

## 2014-10-26 MED ORDER — HYDROCODONE-ACETAMINOPHEN 7.5-325 MG/15ML PO SOLN: 15.0000 mL | Freq: Four times a day (QID) | ORAL | Status: DC | PRN

## 2014-10-26 MED ORDER — OXYMETAZOLINE HCL 0.05 % NA SOLN
NASAL | Status: AC
  Filled 2014-10-26: qty 15

## 2014-10-26 MED ORDER — PROMETHAZINE HCL 25 MG RE SUPP
25.0000 mg | Freq: Four times a day (QID) | RECTAL | Status: DC | PRN
Start: 2014-10-26 — End: 2014-10-26

## 2014-10-26 MED ORDER — PROPOFOL 10 MG/ML IV BOLUS
INTRAVENOUS | Status: DC | PRN
  Administered 2014-10-26: 200 mg via INTRAVENOUS

## 2014-10-26 MED ORDER — DEXAMETHASONE SODIUM PHOSPHATE 4 MG/ML IJ SOLN
INTRAMUSCULAR | Status: DC | PRN
  Administered 2014-10-26: 12 mg via INTRAVENOUS

## 2014-10-26 MED ORDER — OXYCODONE HCL 5 MG/5ML PO SOLN: 5.0000 mg | Freq: Once | ORAL | Status: DC | PRN

## 2014-10-26 MED ORDER — PROMETHAZINE HCL 25 MG/ML IJ SOLN: 6.2500 mg | INTRAMUSCULAR | Status: DC | PRN

## 2014-10-26 SURGICAL SUPPLY — 29 items
CANISTER SUCT 1200ML W/VALVE (MISCELLANEOUS) ×2 IMPLANT
CATH ROBINSON RED A/P 10FR (CATHETERS) IMPLANT
CATH ROBINSON RED A/P 14FR (CATHETERS) IMPLANT
CLEANER CAUTERY TIP 5X5 PAD (MISCELLANEOUS) IMPLANT
COAGULATOR SUCT SWTCH 10FR 6 (ELECTROSURGICAL) ×2 IMPLANT
COVER MAYO STAND STRL (DRAPES) ×2 IMPLANT
ELECT COATED BLADE 2.86 ST (ELECTRODE) ×2 IMPLANT
ELECT REM PT RETURN 9FT ADLT (ELECTROSURGICAL) ×2
ELECT REM PT RETURN 9FT PED (ELECTROSURGICAL)
ELECTRODE REM PT RETRN 9FT PED (ELECTROSURGICAL) IMPLANT
ELECTRODE REM PT RTRN 9FT ADLT (ELECTROSURGICAL) ×1 IMPLANT
GLOVE BIO SURGEON STRL SZ7.5 (GLOVE) ×2 IMPLANT
GLOVE BIOGEL PI IND STRL 7.0 (GLOVE) ×1 IMPLANT
GLOVE BIOGEL PI INDICATOR 7.0 (GLOVE) ×1
GLOVE ECLIPSE 6.5 STRL STRAW (GLOVE) ×2 IMPLANT
GOWN STRL REUS W/ TWL LRG LVL3 (GOWN DISPOSABLE) ×2 IMPLANT
GOWN STRL REUS W/TWL LRG LVL3 (GOWN DISPOSABLE) ×2
NS IRRIG 1000ML POUR BTL (IV SOLUTION) ×2 IMPLANT
PAD CLEANER CAUTERY TIP 5X5 (MISCELLANEOUS)
PENCIL FOOT CONTROL (ELECTRODE) ×2 IMPLANT
SHEET MEDIUM DRAPE 40X70 STRL (DRAPES) ×2 IMPLANT
SOLUTION BUTLER CLEAR DIP (MISCELLANEOUS) ×2 IMPLANT
SPONGE GAUZE 4X4 12PLY STER LF (GAUZE/BANDAGES/DRESSINGS) ×2 IMPLANT
SPONGE TONSIL 1 RF SGL (DISPOSABLE) IMPLANT
SPONGE TONSIL 1.25 RF SGL STRG (GAUZE/BANDAGES/DRESSINGS) IMPLANT
SYR BULB 3OZ (MISCELLANEOUS) ×2 IMPLANT
TOWEL OR 17X24 6PK STRL BLUE (TOWEL DISPOSABLE) ×2 IMPLANT
TUBE CONNECTING 20X1/4 (TUBING) ×2 IMPLANT
TUBE SALEM SUMP 16 FR W/ARV (TUBING) ×2 IMPLANT

## 2014-10-26 NOTE — H&P (Signed)
Jacob Bright is an 24 y.o. male.   Chief Complaint: chronic tonsillitis HPI: 24 year old male with recurrent tonsillitis for the past 3-4 years occurring every 2-4 months.  Presents for surgical management.  Past Medical History  Diagnosis Date  . Exercise-induced asthma     no current med.  . Chronic tonsillitis 09/2014  . 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 Tympanostomy tube placement    . Ankle arthroscopy with reconstruction Right 03/14/2013    Procedure: ANKLE ARTHROSCOPY WITH RECONSTRUCTION;  Surgeon: Nadara MustardMarcus V Duda, MD;  Location: [facility] OR;  Service: Orthopedics;  Laterality: Right;  Right Ankle Gould Modified Brostrom Reconstruction with Ankle Arthroscopy  . Nasal septoplasty w/ turbinoplasty  05/10/2010    bilateral inferior turbinate reduction    Family History  Problem Relation Age of Onset  . Hypertension Father    Social History:  reports that he has never smoked. His smokeless tobacco use includes Chew. He reports that he drinks alcohol. He reports that he does not use illicit drugs.  Allergies:  Allergies  Allergen Reactions  . Codeine Itching    No prescriptions prior to admission    No results found for this or any previous visit (from the past 48 hour(s)). No results found.  Review of Systems  All other systems reviewed and are negative.   Blood pressure 123/72, pulse 77, temperature 98.5 F (36.9 C), temperature source Oral, resp. rate 20, height 6\' 2"  (1.88 m), weight 70.308 kg (155 lb), SpO2 99 %. Physical Exam  Constitutional: He is oriented to person, place, and time. He appears well-developed and well-nourished. No distress.  HENT:  Head: Normocephalic and atraumatic.  Right Ear: External ear normal.  Left Ear: External ear normal.  Nose: Nose normal.  Mouth/Throat: Oropharynx is clear and moist.  Tonsils 2+.  Eyes: Conjunctivae and EOM are normal. Pupils are  equal, round, and reactive to light.  Neck: Normal range of motion. Neck supple.  Cardiovascular: Normal rate.   Respiratory: Effort normal.  Musculoskeletal: Normal range of motion.  Neurological: He is alert and oriented to person, place, and time. No cranial nerve deficit.  Skin: Skin is warm and dry.  Psychiatric: He has a normal mood and affect. His behavior is normal. Judgment and thought content normal.     Assessment/Plan Chronic tonsillitis To OR for tonsillectomy.  Jacob Bright 10/26/2014, 10:07 AM

## 2014-10-26 NOTE — Brief Op Note (Signed)
10/26/2014  10:45 AM  PATIENT:  Jacob Bright  24 y.o. male  PRE-OPERATIVE DIAGNOSIS:  CHRONIC TONSILLITIS  POST-OPERATIVE DIAGNOSIS:  same  PROCEDURE:  Procedure(s): TONSILLECTOMY (N/A)  SURGEON:  Surgeon(s) and Role:    * Christia Readingwight Arantxa Piercey, MD - Primary  PHYSICIAN ASSISTANT:   ASSISTANTS: none   ANESTHESIA:   general  EBL:     BLOOD ADMINISTERED:none  DRAINS: none   LOCAL MEDICATIONS USED:  NONE  SPECIMEN:  Source of Specimen:  Right and left tonsils  DISPOSITION OF SPECIMEN:  PATHOLOGY  COUNTS:  YES  TOURNIQUET:  * No tourniquets in log *  DICTATION: .Other Dictation: Dictation Number [id number]  PLAN OF CARE: Admit for overnight observation  PATIENT DISPOSITION:  PACU - hemodynamically stable.   Delay start of Pharmacological VTE agent (>24hrs) due to surgical blood loss or risk of bleeding: yes

## 2014-10-26 NOTE — Transfer of Care (Signed)
Immediate Anesthesia Transfer of Care Note  Patient: Jacob Bright  Procedure(s) Performed: Procedure(s): TONSILLECTOMY (N/A)  Patient Location: PACU  Anesthesia Type:General  Level of Consciousness: awake, alert  and oriented  Airway & Oxygen Therapy: Patient Spontanous Breathing and Patient connected to face mask oxygen  Post-op Assessment: Report given to RN and Post -op Vital signs reviewed and stable  Post vital signs: Reviewed and stable  Last Vitals:  Filed Vitals:   10/26/14 0919  BP: 123/72  Pulse: 77  Temp: 36.9 C  Resp: 20    Complications: No apparent anesthesia complications

## 2014-10-26 NOTE — Discharge Instructions (Signed)

## 2014-10-26 NOTE — Anesthesia Procedure Notes (Signed)
Procedure Name: Intubation Date/Time: 10/26/2014 10:24 AM Performed by: Zenia ResidesPAYNE, Wylodean Shimmel D Pre-anesthesia Checklist: Patient identified, Emergency Drugs available, Suction available and Patient being monitored Patient Re-evaluated:Patient Re-evaluated prior to inductionOxygen Delivery Method: Circle System Utilized Preoxygenation: Pre-oxygenation with 100% oxygen Intubation Type: IV induction Ventilation: Mask ventilation without difficulty Laryngoscope Size: Mac and 3 Grade View: Grade I Tube type: Oral Number of attempts: 1 Airway Equipment and Method: Stylet and Oral airway Placement Confirmation: ETT inserted through vocal cords under direct vision,  positive ETCO2 and breath sounds checked- equal and bilateral Secured at: 23 cm Tube secured with: Tape Dental Injury: Teeth and Oropharynx as per pre-operative assessment

## 2014-10-26 NOTE — Anesthesia Postprocedure Evaluation (Signed)
Anesthesia Post Note  Patient: Jacob Bright  Procedure(s) Performed: Procedure(s) (LRB): TONSILLECTOMY (N/A)  Anesthesia type: General  Patient location: PACU  Post pain: Pain level controlled  Post assessment: Post-op Vital signs reviewed  Last Vitals: BP 136/82 mmHg  Pulse 75  Temp(Src) 36.6 C (Oral)  Resp 18  Ht 6\' 2"  (1.88 m)  Wt 155 lb (70.308 kg)  BMI 19.89 kg/m2  SpO2 98%  Post vital signs: Reviewed  Level of consciousness: sedated  Complications: No apparent anesthesia complications

## 2014-10-26 NOTE — Anesthesia Preprocedure Evaluation (Addendum)
Anesthesia Evaluation  Patient identified by MRN, date of birth, ID band Patient awake    Reviewed: Allergy & Precautions, NPO status , Patient's Chart, lab work & pertinent test results  Airway Mallampati: I  TM Distance: >3 FB Neck ROM: Full    Dental no notable dental hx.    Pulmonary asthma ,  breath sounds clear to auscultation  Pulmonary exam normal       Cardiovascular negative cardio ROS Normal cardiovascular examRhythm:Regular Rate:Normal     Neuro/Psych PSYCHIATRIC DISORDERS negative neurological ROS     GI/Hepatic negative GI ROS, Neg liver ROS,   Endo/Other  negative endocrine ROS  Renal/GU negative Renal ROS     Musculoskeletal negative musculoskeletal ROS (+)   Abdominal   Peds  Hematology negative hematology ROS (+)   Anesthesia Other Findings   Reproductive/Obstetrics                             Anesthesia Physical Anesthesia Plan  ASA: II  Anesthesia Plan: General   Post-op Pain Management:    Induction: Intravenous  Airway Management Planned: Oral ETT  Additional Equipment: None  Intra-op Plan:   Post-operative Plan: Extubation in OR  Informed Consent: I have reviewed the patients History and Physical, chart, labs and discussed the procedure including the risks, benefits and alternatives for the proposed anesthesia with the patient or authorized representative who has indicated his/her understanding and acceptance.   Dental advisory given  Plan Discussed with: CRNA  Anesthesia Plan Comments:         Anesthesia Quick Evaluation                                   Anesthesia Evaluation  Patient identified by MRN, date of birth, ID band Patient awake    Reviewed: Allergy & Precautions, H&P , NPO status , Patient's Chart, lab work & pertinent test results  Airway       Dental   Pulmonary asthma ,          Cardiovascular      Neuro/Psych    GI/Hepatic   Endo/Other    Renal/GU      Musculoskeletal   Abdominal   Peds  Hematology   Anesthesia Other Findings   Reproductive/Obstetrics                           Anesthesia Physical Anesthesia Plan  ASA: I  Anesthesia Plan: General   Post-op Pain Management:    Induction: Intravenous  Airway Management Planned: LMA  Additional Equipment:   Intra-op Plan:   Post-operative Plan: Extubation in OR  Informed Consent: I have reviewed the patients History and Physical, chart, labs and discussed the procedure including the risks, benefits and alternatives for the proposed anesthesia with the patient or authorized representative who has indicated his/her understanding and acceptance.     Plan Discussed with: CRNA, Anesthesiologist and Surgeon  Anesthesia Plan Comments:         Anesthesia Quick Evaluation

## 2014-10-27 ENCOUNTER — Encounter (HOSPITAL_BASED_OUTPATIENT_CLINIC_OR_DEPARTMENT_OTHER): Payer: Self-pay | Admitting: Otolaryngology

## 2014-10-27 NOTE — Op Note (Signed)
NAMRomilda Joy:  Riga, DAWN                  ACCOUNT NO.:  [id number]  MEDICAL RECORD NO.:  [id number]  LOCATION:                                FACILITY:  [facility]  PHYSICIAN:  Antony Contraswight D Esme Durkin, MD     DATE OF BIRTH:  February 06, 1991  DATE OF PROCEDURE:  10/26/2014 DATE OF DISCHARGE:  10/26/2014                              OPERATIVE REPORT   PREOPERATIVE DIAGNOSIS:  Chronic tonsillitis.  POSTOPERATIVE DIAGNOSIS:  Chronic tonsillitis.  PROCEDURE:  Tonsillectomy.  SURGEON:  Antony Contraswight D Rumaysa Sabatino, MD  ANESTHESIA:  General endotracheal anesthesia.  COMPLICATIONS:  None.  INDICATION:  The patient is a 24 year old male who has had 3 to 4 years of recurring tonsillitis occurring every 2 to 4 months.  He presents to the operating room for surgical management.  FINDINGS:  Tonsils were 1 to 2+ in size bilaterally.  There was a pocket of yellow fluid deep to the tonsil inferiorly on the left.  Tonsils were sent separately for Pathology.  DESCRIPTION OF PROCEDURE:  The patient was identified in the holding room and informed consent having been obtained after discussion of risks, benefits, and alternatives, the patient was brought to the operative suite, and put on the operative table in a supine position. Anesthesia was induced.  The patient was intubated by the anesthesia team without difficulty.  The patient was given intravenous steroids during the case.  The eyes were taped closed and the bed was turned 90 degrees from anesthesia.  Head wrap was placed around the patient's head and a Crowe-Davis retractor was inserted in the mouth and opened to reveal the oropharynx.  This was placed in suspension on Mayo stand. The right tonsil was grasped with a curved Allis and retracted medially while a curvilinear incision was made along the anterior tonsillar pillar using Bovie cautery on a setting of 20.  Dissection continued in the subcapsular plane until the tonsil was removed.  The same procedure was then carried  out on the opposite side.  Tonsils were sent separately for Pathology.  Bleeding was controlled on both sides using suction cautery on a setting of 30.  After this was completed, the nose and throat were copiously irrigated with saline and a flexible catheter was passed down the esophagus to suction out the stomach and the esophagus.  The retractor was taken out of suspension and removed from the patient's mouth.  He was turned back to Anesthesia for wake up, was extubated, and was moved to the recovery room in a stable condition.     Antony Contraswight D Ixel Boehning, MD     DDB/MEDQ  D:  10/26/2014  T:  10/26/2014  Job:  [id number]  cc:   Antony Contraswight D Chanan Detwiler, MD

## 2014-10-27 NOTE — Addendum Note (Signed)
Addendum  created 10/27/14 1512 by Ronnette HilaLinda D Dao Memmott, CRNA   Modules edited: Charges VN

## 2014-10-29 ENCOUNTER — Telehealth: Payer: Self-pay | Admitting: Family Medicine

## 2014-10-29 MED ORDER — AMPHETAMINE-DEXTROAMPHETAMINE 20 MG PO TABS: 20.0000 mg | ORAL_TABLET | Freq: Two times a day (BID) | ORAL | Status: DC

## 2014-10-29 NOTE — Telephone Encounter (Signed)
Pt needs refill Adderall  

## 2014-11-09 ENCOUNTER — Telehealth: Payer: Self-pay | Admitting: Family Medicine

## 2014-11-09 MED ORDER — AMPHETAMINE-DEXTROAMPHETAMINE 20 MG PO TABS: 20.0000 mg | ORAL_TABLET | Freq: Three times a day (TID) | ORAL | Status: DC

## 2014-11-09 NOTE — Telephone Encounter (Signed)
To previous prescriptions were written for 60 tablets instead of the normal 90. I rewrote the prescriptions for July and August plus gave him a smaller 30 pill prescription to cover for the rest of this month. Turn his other prescriptions in

## 2014-11-09 NOTE — Telephone Encounter (Signed)
Pt just realized that his last three Adderall 20mg  scripts were written for #60 instead of his normal #90. Pt has already filled the first month script and he will be short 30 pills. Can pt get another script for #30 to make up for the difference and then 2 new scripts for Adderall 20mg  #90 to replace the other two scripts that he still has?

## 2014-12-17 ENCOUNTER — Encounter: Payer: Self-pay | Admitting: Family Medicine

## 2014-12-17 ENCOUNTER — Ambulatory Visit (INDEPENDENT_AMBULATORY_CARE_PROVIDER_SITE_OTHER): Payer: 59 | Admitting: Family Medicine

## 2014-12-17 VITALS — BP 114/78 | HR 88 | Ht 74.0 in | Wt 158.0 lb

## 2014-12-17 DIAGNOSIS — M545 Low back pain, unspecified: Secondary | ICD-10-CM

## 2014-12-17 DIAGNOSIS — M62830 Muscle spasm of back: Secondary | ICD-10-CM

## 2014-12-17 MED ORDER — METAXALONE 800 MG PO TABS: 800.0000 mg | ORAL_TABLET | Freq: Three times a day (TID) | ORAL | Status: DC | PRN

## 2014-12-17 MED ORDER — HYDROCODONE-ACETAMINOPHEN 7.5-325 MG PO TABS: 1.0000 | ORAL_TABLET | Freq: Four times a day (QID) | ORAL | Status: DC | PRN

## 2014-12-17 MED ORDER — KETOROLAC TROMETHAMINE 60 MG/2ML IM SOLN
60.0000 mg | Freq: Once | INTRAMUSCULAR | Status: AC
  Administered 2014-12-17: 60 mg via INTRAMUSCULAR

## 2014-12-17 NOTE — Patient Instructions (Signed)
  Continue to take ibuprofen (advil)  three times daily, with food.  You must wait 6 hours after the shot (toradol) that you got in the office before taking your next dose. Use the skelaxin every 8 hours as needed for muscle spasm.  If it makes you tired during the day, try cutting the dose in half. If you still Romelle't like it (not helping, or too sedating), then call for the robaxin prescription.  Use the hydrocodone sparingly, as needed for severe pain that isn't controlled with the anti-inflammatory (ibuprofen) and muscle relaxant. This medication masks the pain, doesn't fix it, so please take the other  Medications along with it, and be very careful with your bending, twisting and lifting. Use moist heat to the area at least 3 times/day (consider Thermacare). Do regular stretches, and after heat and stretching, try some gentle massage.  Return if you develop numbness, tingling, weakness of the leg, fever, bowel or bladder problems.  Light duty is recommended for the next week (no heavy lifting or bending)

## 2014-12-17 NOTE — Progress Notes (Signed)
Chief Complaint  Patient presents with  . Back Pain    lbp, was doing work and pulled left lower back-did this same thing back in March.    While building a retaining wall at his house yesterday, when trying to pull a root out of the ground, he jerked up, and felt acute onset of left sided low back pain.  It radiates slightly into the hip, feeling a little sore, no pain into the leg. No numbness, tingling or weakness.  Denies bowel or bladder problems.  He has been using heat, and took advil 800mg  at 3pm yesterday, 600mg  at bedtime, and 600mg  this morning.  It has helped very slightly. Heat helps.  Denies any abdominal pain or side effects.  He had similar problem back in March/April.  He reports that he took advil, vicodin when he needed to work; he also used heat and stretches and it gradually improved, especially when he had a week off of work.    He works as a Administrator, and does lifting, digging.  He reports he is in so much pain he can't even hold a shovel.   Chart reviewed--last injury was in March, which lasted for a few weeks. He previously took robaxin for other injuries and tolerated without sedation.  Can't recall how well it worked. He didn't like how prednisone made him feel.  Flexeril made him too sedated, could only take at bedtime, not during the day.  There is documentation about controlled substance abuse.  When asked directly about this, he reports that he failed a drug test for cocaine 2 years ago when playing soccer. Denies problems with narcotics.  PMH, PSH, SH reviewed.  Outpatient Encounter Prescriptions as of 12/17/2014  Medication Sig  . amphetamine-dextroamphetamine (ADDERALL) 20 MG tablet Take 1 tablet (20 mg total) by mouth 3 (three) times daily.  Marland Kitchen amphetamine-dextroamphetamine (ADDERALL) 20 MG tablet Take 1 tablet (20 mg total) by mouth 3 (three) times daily.  Melene Muller ON 12/29/2014] amphetamine-dextroamphetamine (ADDERALL) 20 MG tablet Take 1 tablet (20 mg  total) by mouth 3 (three) times daily.  Marland Kitchen HYDROcodone-acetaminophen (NORCO) 7.5-325 MG per tablet Take 1 tablet by mouth every 6 (six) hours as needed for moderate pain or severe pain. One twice a day as needed  . metaxalone (SKELAXIN) 800 MG tablet Take 1 tablet (800 mg total) by mouth 3 (three) times daily as needed for muscle spasms.  . [DISCONTINUED] HYDROcodone-acetaminophen (HYCET) 7.5-325 mg/15 ml solution Take 15 mLs by mouth 4 (four) times daily as needed for moderate pain.  . [EXPIRED] ketorolac (TORADOL) injection 60 mg    No facility-administered encounter medications on file as of 12/17/2014.   Allergies  Allergen Reactions  . Codeine Itching   ROS:  No fever, chills, urinary complaints. No numbness, tingling, weakness, bowel/bladder problem. No bleeding, bruising, rash, URI symptoms, cough, shortness of breath, chest pain or other concerns.  See HPI  PHYSICAL EXAM: BP 114/78 mmHg  Pulse 88  Ht 6\' 2"  (1.88 m)  Wt 158 lb (71.668 kg)  BMI 20.28 kg/m2 Well developed, pleasant male, in moderate discomfort with bending/twisting; appears comfortable at rest. Spine nontender. No CVA tenderness Mildly tender over the left SI joint, lower lumbar paraspinous muscles on the left, and the left upper buttock muscles. Normal strength, sensation, SLR and DTR's  ASSESSMENT/PLAN:  Muscle spasm of back - Plan: metaxalone (SKELAXIN) 800 MG tablet, ketorolac (TORADOL) injection 60 mg  Left-sided low back pain without sciatica - Plan: HYDROcodone-acetaminophen (NORCO) 7.5-325 MG  per tablet, ketorolac (TORADOL) injection 60 mg   Toradol IM  Counseled re: risks/side effects of medications (muscle relaxants, narcotics, NSAIDs). Encouraged to use NSAIDs, muscle relaxants and heat, and use the hydrocodone only sparingly for severe pain. 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    Continue to take ibuprofen (advil)   three times daily, with food.  You must wait 6 hours after the shot (toradol) that you got in the office before taking your next dose. Use the skelaxin every 8 hours as needed for muscle spasm.  If it makes you tired during the day, try cutting the dose in half. If you still Kelechi't like it (not helping, or too sedating), then call for the robaxin prescription.  Use the hydrocodone sparingly, as needed for severe pain that isn't controlled with the anti-inflammatory (ibuprofen) and muscle relaxant. This medication masks the pain, doesn't fix it, so please take the other  Medications along with it, and be very careful with your bending, twisting and lifting. Use moist heat to the area at least 3 times/day (consider Thermacare). Do regular stretches, and after heat and stretching, try some gentle massage.  Return if you develop numbness, tingling, weakness of the leg, fever, bowel or bladder problems.

## 2014-12-22 ENCOUNTER — Telehealth: Payer: Self-pay | Admitting: Family Medicine

## 2014-12-22 NOTE — Telephone Encounter (Signed)
If he is still having difficulty, he needs to be reevaluated.

## 2014-12-22 NOTE — Telephone Encounter (Signed)
Pt called requesting refill on his pain medication for his back

## 2014-12-30 ENCOUNTER — Telehealth: Payer: Self-pay | Admitting: Family Medicine

## 2014-12-30 MED ORDER — METHOCARBAMOL 500 MG PO TABS: 500.0000 mg | ORAL_TABLET | Freq: Four times a day (QID) | ORAL | Status: DC | PRN

## 2014-12-30 NOTE — Telephone Encounter (Signed)
Advise pt--needs OV with JCL for f/u for narcotics.  I'm okay with changing him from skelaxin to robaxin (we discussed both at his visit).  Robaxin sent.  Needs OV (with [facility]) for narcotic refill

## 2014-12-30 NOTE — Telephone Encounter (Signed)
Patient advised.

## 2014-12-30 NOTE — Telephone Encounter (Signed)
Pt called stating that he would like you to switch his muscle relaxer to Robaxin and needs refill on his pain medication.   Advised last note said he needed to come back in and he states that was because J[facility] hadn't seen him for this and you had so he wanted to ask you for a refill.

## 2015-01-05 ENCOUNTER — Telehealth: Payer: Self-pay | Admitting: Family Medicine

## 2015-01-05 ENCOUNTER — Telehealth: Payer: Self-pay

## 2015-01-05 MED ORDER — AMPHETAMINE-DEXTROAMPHETAMINE 20 MG PO TABS: 20.0000 mg | ORAL_TABLET | Freq: Three times a day (TID) | ORAL | Status: DC

## 2015-01-05 NOTE — Telephone Encounter (Signed)
Pt called back and states he does have August meds, he needs Sept. Oct & Nov, because he is getting ready to go back to school.

## 2015-01-05 NOTE — Telephone Encounter (Signed)
Pt called for refills of adderall. Call [phone] when ready.

## 2015-01-05 NOTE — Telephone Encounter (Signed)
Chart shows pt got refill on 12/29/14,  Called pt back to ask about that RF and had to leave a message

## 2015-01-05 NOTE — Telephone Encounter (Signed)
Called and talked to pt. He knows his prescriptions are ready to be picked up. Three scripts of Adderall:  #60 01/29/15,  #30 02/28/15, and  #30 03/31/15.

## 2015-01-12 ENCOUNTER — Ambulatory Visit (INDEPENDENT_AMBULATORY_CARE_PROVIDER_SITE_OTHER): Payer: 59 | Admitting: Family Medicine

## 2015-01-12 ENCOUNTER — Other Ambulatory Visit: Payer: Self-pay

## 2015-01-12 ENCOUNTER — Telehealth: Payer: Self-pay

## 2015-01-12 VITALS — BP 122/80 | HR 124 | Wt 155.0 lb

## 2015-01-12 DIAGNOSIS — M79606 Pain in leg, unspecified: Secondary | ICD-10-CM | POA: Diagnosis not present

## 2015-01-12 DIAGNOSIS — F1910 Other psychoactive substance abuse, uncomplicated: Secondary | ICD-10-CM | POA: Diagnosis not present

## 2015-01-12 DIAGNOSIS — F902 Attention-deficit hyperactivity disorder, combined type: Secondary | ICD-10-CM | POA: Diagnosis not present

## 2015-01-12 DIAGNOSIS — F1911 Other psychoactive substance abuse, in remission: Secondary | ICD-10-CM

## 2015-01-12 MED ORDER — CARISOPRODOL 350 MG PO TABS: 350.0000 mg | ORAL_TABLET | Freq: Four times a day (QID) | ORAL | Status: DC | PRN

## 2015-01-12 NOTE — Telephone Encounter (Signed)
Per Dr Susann Givens add

## 2015-01-12 NOTE — Patient Instructions (Signed)
Take 2 Aleve twice a day, undergoing given soma take it at night

## 2015-01-12 NOTE — Progress Notes (Signed)
   Subjective:    Patient ID: Jacob Bright, male    DOB: 12/04/1990, 24 y.o.   MRN: [id number]  HPI  He is here for consult concerning his ADHD. He is now back in school and living in a dorm. He states that the medication does help him stay focused. He is using this 3 times a day. He states that his back is feeling much better but he has noted increased difficulty with discomfort in both legs and the restless feeling. He has tried various over-the-counter medications without much success. He does not drink caffeine and is not on the decongestion. Does not drink. He states he is not taken any street drugs since last year. He was in [employer] last December or a 28 day program. He did not inform me of this stating he thought that I knew the cause he filled out paperwork indicating that I was his PCP. I explained that I had no clue that he was in [facility]. He is apparently scheduled to see Dr. Colbert Nation for follow-up on his ADD. This was apparently arranged by his mother. He also mentioned seeing Dr. Jennelle Human who apparently knows his family well and sees other family members. He is having difficulty with feeling down about being back in college and the mistakes he made last year. 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   Review of Systems     Objective:   Physical Exam Alert and in no distress. No tenderness to palpation of the calf or thighs. DTRs normal. Pulses normal.       Assessment & Plan:  Pain of lower extremity, unspecified laterality - Plan: carisoprodol (SOMA) 350 MG tablet  ADHD (attention deficit hyperactivity disorder), combined type  History of drug abuse in remission I will put him on a short course of soma to see if this will help but certainly not keep him on this long-term. I also explained that the fact that he did not tell me he was in [employer] and the fact that over the last several months he has obtained codeine through [place] for back pain has  been quite concerned. Best to considering his previous history. Encouraged him to keep his appointment with Dr. Carmela Hurt. Also he is to let Dr. Carmela Hurt myself and Dr. Jennelle Human In the loop. I explained that Adderall might not be the best choice for him.Over 25 minutes, greater than 50% spent in counseling and coordination of care.

## 2015-01-13 ENCOUNTER — Telehealth: Payer: Self-pay

## 2015-01-13 NOTE — Telephone Encounter (Signed)
Pt called saying he spoke with you recently about his Adderall and he said it turns out he has less than he thought he did and would like to talk to you about that.

## 2015-04-21 ENCOUNTER — Ambulatory Visit (INDEPENDENT_AMBULATORY_CARE_PROVIDER_SITE_OTHER): Payer: Managed Care, Other (non HMO) | Admitting: Family Medicine

## 2015-04-21 ENCOUNTER — Encounter: Payer: Self-pay | Admitting: Family Medicine

## 2015-04-21 VITALS — BP 144/80 | HR 70 | Ht 74.0 in | Wt 166.0 lb

## 2015-04-21 DIAGNOSIS — F902 Attention-deficit hyperactivity disorder, combined type: Secondary | ICD-10-CM

## 2015-04-21 MED ORDER — AMPHETAMINE-DEXTROAMPHET ER 20 MG PO CP24: 20.0000 mg | ORAL_CAPSULE | Freq: Every day | ORAL | Status: DC

## 2015-04-21 NOTE — Progress Notes (Signed)
   Subjective:    Patient ID: Jacob Bright, male    DOB: 1990/09/08, 24 y.o.   MRN: [id number]  HPI He is here concerning his ADHD. He is taking 12 hours but they are intense and he has been using Adderall 4 times per day to help get through his study regimen. He states the medicine lasts proximally 3 hours and he knows when it wears off because he gets hungry again. He states that it has not interfered with his sleep pattern.   Review of Systems     Objective:   Physical Exam Alert and in no distress otherwise not examined       Assessment & Plan:  ADHD (attention deficit hyperactivity disorder), combined type - Plan: amphetamine-dextroamphetamine (ADDERALL XR) 20 MG 24 hr capsule  I will try him on a longer acting one and given twice a day dosing. He is to let me know if it works, how long and how much trouble he has with eating and sleeping. Also of note is he is thinking of transferring to [employer] and changing his major.

## 2015-05-18 ENCOUNTER — Telehealth: Payer: Self-pay | Admitting: Family Medicine

## 2015-05-18 NOTE — Telephone Encounter (Signed)
Needs refill on adderal 20 mg   Wants to go back to 20 mg 3 times a day   New/ other med took appetite away

## 2015-05-19 ENCOUNTER — Telehealth: Payer: Self-pay | Admitting: Family Medicine

## 2015-05-19 MED ORDER — AMPHETAMINE-DEXTROAMPHETAMINE 20 MG PO TABS: 20.0000 mg | ORAL_TABLET | Freq: Three times a day (TID) | ORAL | Status: DC

## 2015-05-19 NOTE — Telephone Encounter (Signed)
The Adderall XR interfered with his eating and lost weight on it. He would like to be switched back to plain Adderall 20 and take it 3 times per day. It usually lasts roughly 4 hours. Prescription will be written.

## 2015-05-19 NOTE — Telephone Encounter (Signed)
Pt returned call to Dr Susann GivensLalonde. Requesting that Dr Susann GivensLalonde call him back

## 2015-08-13 ENCOUNTER — Telehealth: Payer: Self-pay | Admitting: Family Medicine

## 2015-08-13 NOTE — Telephone Encounter (Signed)
Pt was advised that he will have to wait until Monday when Dr. Susann GivensLalonde comes back to refill it as he should have enough until march 28th. Please refill med for pt

## 2015-08-13 NOTE — Telephone Encounter (Signed)
Pt called and is requesting a refill on his adderall informed pt dr Susann Givenslalonde is out of the office, pt is going out of town, and would like them today if possible, pt can be reached at [phone] (H)

## 2015-08-13 NOTE — Telephone Encounter (Signed)
Please call and find out when he will back in town. He should have enough until March 28 and Dr. Susann GivensLalonde will be back by then.

## 2015-08-16 ENCOUNTER — Telehealth: Payer: Self-pay | Admitting: Family Medicine

## 2015-08-16 MED ORDER — AMPHETAMINE-DEXTROAMPHETAMINE 20 MG PO TABS: 20.0000 mg | ORAL_TABLET | Freq: Three times a day (TID) | ORAL | Status: DC

## 2015-08-16 NOTE — Telephone Encounter (Signed)
Pt informed that Rx is ready to be picked up

## 2015-11-08 ENCOUNTER — Telehealth: Payer: Self-pay | Admitting: Family Medicine

## 2015-11-08 MED ORDER — AMPHETAMINE-DEXTROAMPHETAMINE 20 MG PO TABS: 20.0000 mg | ORAL_TABLET | Freq: Three times a day (TID) | ORAL | Status: DC

## 2015-11-08 NOTE — Telephone Encounter (Signed)
Have him come by and get the Rx but warn him that he will not be able to get it til the 28th

## 2015-11-08 NOTE — Telephone Encounter (Signed)
Requesting  3 month of refill on Adderall refill.  Pt is going out of town tomorrow so he will need script asap. Call pt at [phone] when script is ready for pick up.

## 2015-11-08 NOTE — Telephone Encounter (Signed)
Left message for pt, advised Rx Adderall x 3 ready for pick up.  Also need to inform pt dated 11/17/15

## 2015-11-24 ENCOUNTER — Telehealth: Payer: Self-pay

## 2015-11-24 MED ORDER — AMPHETAMINE-DEXTROAMPHET ER 20 MG PO CP24: 20.0000 mg | ORAL_CAPSULE | Freq: Two times a day (BID) | ORAL | Status: DC

## 2015-11-24 NOTE — Telephone Encounter (Signed)
Pt called the office requesting to speak with Dr. Susann GivensLalonde directly about switching his Adderall. Advised pt that it maybe advised that he may need OV, but would send message to you to see what you recommended. No other details given. Pt's number is [phone]

## 2015-11-24 NOTE — Telephone Encounter (Signed)
He has a job that requires more constant concentration. He was taking regular Adderall that was lasting only 3 hours. He does remember the extended release lasting much longer. I will give him 20 mg twice a day and he is to let me know how this works.

## 2016-01-04 ENCOUNTER — Telehealth: Payer: Self-pay

## 2016-01-04 MED ORDER — AMPHETAMINE-DEXTROAMPHET ER 20 MG PO CP24: 20.0000 mg | ORAL_CAPSULE | Freq: Two times a day (BID) | ORAL | 0 refills | Status: DC

## 2016-01-04 MED ORDER — AMPHETAMINE-DEXTROAMPHET ER 20 MG PO CP24: 20.0000 mg | ORAL_CAPSULE | ORAL | 0 refills | Status: DC

## 2016-01-04 NOTE — Telephone Encounter (Signed)
Pt called to let us know he is moving away to college this afternoon and needs refills on his Adderall. Please call [phone] when ready.

## 2016-01-04 NOTE — Telephone Encounter (Signed)
He is getting ready to go to [facility] [employer]. 3 prescriptions were written for his Adderall. He is still doing quite well on it.

## 2016-02-17 ENCOUNTER — Telehealth: Payer: Self-pay

## 2016-02-17 NOTE — Telephone Encounter (Signed)
Ok

## 2016-02-17 NOTE — Telephone Encounter (Signed)
Gave alicia the okay

## 2016-02-17 NOTE — Telephone Encounter (Signed)
Helmut Musterlicia with [facility] [phone] called to let us know that pt is requesting early fill of Rx Adderall. She states script from 08/15 was written 1 tablet bid but dispense was #30. Pt has another script at pharmacy for "do not fill before 03/05/2016" with dispense of # 60. Helmut Musterlicia is requesting early fill date of today since other script was written for 15 day supply. Trixie Rude/RLB

## 2016-03-15 ENCOUNTER — Telehealth: Payer: Self-pay | Admitting: Family Medicine

## 2016-03-15 MED ORDER — AMPHETAMINE-DEXTROAMPHET ER 20 MG PO CP24: 20.0000 mg | ORAL_CAPSULE | Freq: Two times a day (BID) | ORAL | 0 refills | Status: DC

## 2016-03-15 MED ORDER — AMPHETAMINE-DEXTROAMPHET ER 15 MG PO CP24: 15.0000 mg | ORAL_CAPSULE | ORAL | 0 refills | Status: DC

## 2016-03-15 NOTE — Telephone Encounter (Signed)
Pt called and is stating that he needs a refill on his adderall pt would like them mailed to him he is in [place], and is out of his RX pt address is [address] [place] [id number]

## 2016-03-15 NOTE — Telephone Encounter (Signed)
Mailed rx to pt

## 2016-03-21 ENCOUNTER — Telehealth: Payer: Self-pay | Admitting: Family Medicine

## 2016-03-21 ENCOUNTER — Ambulatory Visit (INDEPENDENT_AMBULATORY_CARE_PROVIDER_SITE_OTHER): Payer: Managed Care, Other (non HMO) | Admitting: Family Medicine

## 2016-03-21 ENCOUNTER — Encounter: Payer: Self-pay | Admitting: Family Medicine

## 2016-03-21 VITALS — BP 110/70 | HR 90 | Wt 176.0 lb

## 2016-03-21 DIAGNOSIS — S39012A Strain of muscle, fascia and tendon of lower back, initial encounter: Secondary | ICD-10-CM

## 2016-03-21 DIAGNOSIS — F902 Attention-deficit hyperactivity disorder, combined type: Secondary | ICD-10-CM | POA: Diagnosis not present

## 2016-03-21 MED ORDER — AMPHETAMINE-DEXTROAMPHET ER 20 MG PO CP24: 20.0000 mg | ORAL_CAPSULE | Freq: Two times a day (BID) | ORAL | 0 refills | Status: DC

## 2016-03-21 MED ORDER — CYCLOBENZAPRINE HCL 10 MG PO TABS: 10.0000 mg | ORAL_TABLET | Freq: Three times a day (TID) | ORAL | 0 refills | Status: DC | PRN

## 2016-03-21 MED ORDER — HYDROCODONE-ACETAMINOPHEN 5-325 MG PO TABS: 1.0000 | ORAL_TABLET | Freq: Four times a day (QID) | ORAL | 0 refills | Status: DC | PRN

## 2016-03-21 MED ORDER — AMPHETAMINE-DEXTROAMPHET ER 20 MG PO CP24: 20.0000 mg | ORAL_CAPSULE | ORAL | 0 refills | Status: DC

## 2016-03-21 NOTE — Telephone Encounter (Signed)
The prescriptions were written incorrectly. They were rewritten twice a day dosing #60 starting October 31, November 30 and December 31.

## 2016-03-21 NOTE — Progress Notes (Signed)
   Subjective:    Patient ID: Jacob Bright, male    DOB: 1990/08/22, 25 y.o.   MRN: [id number]  HPI Saturday he went flyfishing and slipped on the rocks and fell and has had difficulty with left-sided lumbar pain and spasm. He has had difficulty with this in the past and did respond to conservative care and the use of Flexeril. He also is here to pick up his Adderall prescription he continues to do quite nicely on that medication. 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  Review of Systems     Objective:   Physical Exam Alert and complaining of back pain. No palpable spasm is noted by does complain of pain in the left mid lumbar area. Good motion of his back.       Assessment & Plan:  ADHD (attention deficit hyperactivity disorder), combined type  Back strain, initial encounter - Plan: cyclobenzaprine (FLEXERIL) 10 MG tablet, HYDROcodone-acetaminophen (NORCO) 5-325 MG tablet Recommend continued conservative care for this. Cautioned on the use of codeine and Flexeril at the same time.

## 2016-03-21 NOTE — Telephone Encounter (Signed)
Pt called and stated that he received his rx's . He states that there are issues with them. He states that some are for wrong amount and then one is for wrong refill date. Please call pt at [phone].

## 2016-05-19 ENCOUNTER — Telehealth: Payer: Self-pay | Admitting: Internal Medicine

## 2016-05-19 MED ORDER — AMPHETAMINE-DEXTROAMPHET ER 25 MG PO CP24: 25.0000 mg | ORAL_CAPSULE | Freq: Two times a day (BID) | ORAL | 0 refills | Status: DC

## 2016-05-19 NOTE — Telephone Encounter (Signed)
Pt called and states that he wants you to call him to talk about his adderall. He has some questions for you.

## 2016-05-19 NOTE — Telephone Encounter (Signed)
He's not getting the same benefit out of the Adderall as in the past. I will try Motrin the next higher dosing.

## 2016-05-31 ENCOUNTER — Telehealth: Payer: Self-pay | Admitting: Family Medicine

## 2016-05-31 NOTE — Telephone Encounter (Signed)
PA complete on Adderall with quantity exception.  Await results

## 2016-06-02 NOTE — Telephone Encounter (Signed)
P.A. Gibson Rampenied.  I called [employer] T# [phone] & was advised they need additional clinical reasons in writing stating why this patient needs 2 a day for quantity limits exception and diagnosis code and this information faxed to  ATTN: Nurse Review fax # [phone] including Key# [id number]

## 2016-06-05 ENCOUNTER — Encounter: Payer: Self-pay | Admitting: Family Medicine

## 2016-06-08 ENCOUNTER — Telehealth: Payer: Self-pay | Admitting: Family Medicine

## 2016-06-08 NOTE — Telephone Encounter (Signed)
P.A. ADDERALL XR & QUANTITY LIMITS Letter was typed & faxed. Called Nurse review department was approved from 05/31/16 til end of policy.  Called pt & informed

## 2016-06-14 ENCOUNTER — Telehealth: Payer: Self-pay | Admitting: Family Medicine

## 2016-06-14 MED ORDER — AMPHETAMINE-DEXTROAMPHET ER 25 MG PO CP24: 25.0000 mg | ORAL_CAPSULE | Freq: Two times a day (BID) | ORAL | 0 refills | Status: DC

## 2016-06-14 NOTE — Telephone Encounter (Signed)
[place]eds Rx adderal next 3 months [place] [facility]  [address] [place], [place]  [id number]

## 2016-06-15 ENCOUNTER — Telehealth: Payer: Self-pay | Admitting: Family Medicine

## 2016-06-15 NOTE — Telephone Encounter (Signed)
Mailed rx to pt

## 2016-06-20 ENCOUNTER — Telehealth: Payer: Self-pay | Admitting: Family Medicine

## 2016-06-20 NOTE — Telephone Encounter (Addendum)
Pt states Dr. Susann GivensLalonde has changed the dosage and needs new prior auth. I called Clinical review team to see if dosage could be changed from previous P.A. Over the phone t# [phone] & unable to do over the phone, must start new P.A. New P.A. Was initiated online

## 2016-06-21 NOTE — Telephone Encounter (Signed)
P.A. ADDERALL XR 25MG 

## 2016-06-25 NOTE — Telephone Encounter (Signed)
P.A. Approved til 06/21/19, pt informed

## 2016-07-31 ENCOUNTER — Telehealth: Payer: Self-pay | Admitting: Family Medicine

## 2016-07-31 MED ORDER — AMPHETAMINE-DEXTROAMPHET ER 20 MG PO CP24: 20.0000 mg | ORAL_CAPSULE | ORAL | 0 refills | Status: DC

## 2016-07-31 NOTE — Telephone Encounter (Signed)
Pt requesting to have Adderall dose changed back to 20mg . Pt said the 25mg  is too much. [place] script [place] that[place] listed in the system

## 2016-07-31 NOTE — Telephone Encounter (Signed)
rx mailed kds

## 2016-08-21 ENCOUNTER — Encounter: Payer: Self-pay | Admitting: Family Medicine

## 2016-08-21 ENCOUNTER — Ambulatory Visit (INDEPENDENT_AMBULATORY_CARE_PROVIDER_SITE_OTHER): Payer: BLUE CROSS/BLUE SHIELD | Admitting: Family Medicine

## 2016-08-21 VITALS — BP 120/90 | HR 120 | Wt 168.0 lb

## 2016-08-21 DIAGNOSIS — M545 Low back pain, unspecified: Secondary | ICD-10-CM

## 2016-08-21 DIAGNOSIS — F902 Attention-deficit hyperactivity disorder, combined type: Secondary | ICD-10-CM | POA: Diagnosis not present

## 2016-08-21 DIAGNOSIS — S39012A Strain of muscle, fascia and tendon of lower back, initial encounter: Secondary | ICD-10-CM | POA: Diagnosis not present

## 2016-08-21 MED ORDER — AMPHETAMINE-DEXTROAMPHET ER 20 MG PO CP24: 20.0000 mg | ORAL_CAPSULE | Freq: Two times a day (BID) | ORAL | 0 refills | Status: DC

## 2016-08-21 MED ORDER — HYDROCODONE-ACETAMINOPHEN 5-325 MG PO TABS: 1.0000 | ORAL_TABLET | Freq: Four times a day (QID) | ORAL | 0 refills | Status: DC | PRN

## 2016-08-21 NOTE — Patient Instructions (Signed)
Heat for 20 minutes 3 times a day. 4 ibuprofen 3 times per day. Take the codeine mainly at night

## 2016-08-22 NOTE — Progress Notes (Signed)
   Subjective:    Patient ID: Jacob Bright, male    DOB: 07/04/1990, 26 y.o.   MRN: [id number]  HPI He is here for evaluation of back pain. While playing golf left-handed he hit a root sustaining some pain in the low back mainly on the left. He's had previous trouble with that. He complains of pain on motion. No numbness, tingling, weakness, bowel or bladder trouble. He also needs his ADD medications renewed. He is taking it twice a day and getting good response with focus and having very little difficulty when he comes off the medication. School is going quite well for him.  Review of Systems     Objective:   Physical Exam Alert and in no distress. Slight palpable tenderness noted in the upper lumbar paravertebral muscles. Normal lumbar curve and motion.       Assessment & Plan:  ADHD (attention deficit hyperactivity disorder), combined type - Plan: amphetamine-dextroamphetamine (ADDERALL XR) 20 MG 24 hr capsule, amphetamine-dextroamphetamine (ADDERALL XR) 20 MG 24 hr capsule, amphetamine-dextroamphetamine (ADDERALL XR) 20 MG 24 hr capsule  Back strain, initial encounter - Plan: HYDROcodone-acetaminophen (NORCO) 5-325 MG tablet  Acute left-sided low back pain without sciatica - Plan: HYDROcodone-acetaminophen (NORCO) 5-325 MG tablet Recommend heat, stretching and anti-inflammatory. We'll give codeine to be used mainly at night. His Adderall was also refilled.

## 2016-09-11 ENCOUNTER — Telehealth: Payer: Self-pay

## 2016-09-11 MED ORDER — AMPHETAMINE-DEXTROAMPHET ER 25 MG PO CP24: 25.0000 mg | ORAL_CAPSULE | ORAL | 0 refills | Status: DC

## 2016-09-11 NOTE — Telephone Encounter (Signed)
Pt request refill of Adderall to be bumped up to . CB # [phone]

## 2016-09-11 NOTE — Telephone Encounter (Signed)
Olegario Messier spoke with pt- he will not be back in [place] until 7pm and has to leave at [place], so he will leave scripts in an envelope on the back door and pick up new scripts. This is ok per [place]. Trixie Rude

## 2016-09-11 NOTE — Telephone Encounter (Signed)
Let him know that he needs to bring the other prescriptions in with him

## 2016-09-12 ENCOUNTER — Telehealth: Payer: Self-pay | Admitting: Family Medicine

## 2016-09-12 NOTE — Telephone Encounter (Signed)
Pt returned prior rx's.

## 2016-09-15 ENCOUNTER — Telehealth: Payer: Self-pay | Admitting: Family Medicine

## 2016-09-15 NOTE — Telephone Encounter (Signed)
Pt called and states that the rx for the adderral was written for 30 and needs to be written for 60, he got the first one filled, he states that if you write him for another  30  adderral XR  the insurance will pay for it according to pharmacy, and if you could rewrite is next 2 month RX's for 60 , pt is coming in town today and will be leaving Sunday night informed him you will be out of town until Monday, states if we could just mail them to him pt can be reached at [phone]

## 2016-09-18 MED ORDER — AMPHETAMINE-DEXTROAMPHET ER 25 MG PO CP24: 25.0000 mg | ORAL_CAPSULE | Freq: Two times a day (BID) | ORAL | 0 refills | Status: DC

## 2016-09-18 MED ORDER — AMPHETAMINE-DEXTROAMPHETAMINE 30 MG PO TABS: 30.0000 mg | ORAL_TABLET | Freq: Two times a day (BID) | ORAL | 0 refills | Status: DC

## 2016-09-18 NOTE — Telephone Encounter (Signed)
The previous prescriptions for written for daily use and that should've been twice a day. I will also cover him with regular Adderall for the rest of April. He will bring back the older prescriptions.

## 2016-10-17 ENCOUNTER — Telehealth: Payer: Self-pay | Admitting: Family Medicine

## 2016-10-17 NOTE — Telephone Encounter (Signed)
Pt accidentally filled wrong RX for amphetamine-dex (Adderall XR) 25mg  #30 instead Adderall XR 25mg  #60 which states this time was his fault.  Request that you write him another Rx (like you had to do last month)for #60 30mg  instance release Adderall since he's already had this one filled and he returned the 11/11/16 Rx for Adderall XR 25mg  #30 so this will not happen next month.... Vernona RiegerLaura

## 2016-10-17 NOTE — Telephone Encounter (Signed)
Pt states you can call him if you have any questions

## 2016-10-17 NOTE — Telephone Encounter (Signed)
Called pt & explained Dr. Susann GivensLalonde would like him to come in for appt and bring meds tomorrow, he agreed

## 2016-10-18 ENCOUNTER — Ambulatory Visit (INDEPENDENT_AMBULATORY_CARE_PROVIDER_SITE_OTHER): Payer: BLUE CROSS/BLUE SHIELD | Admitting: Family Medicine

## 2016-10-18 ENCOUNTER — Encounter: Payer: Self-pay | Admitting: Family Medicine

## 2016-10-18 VITALS — BP 130/80 | HR 117 | Wt 178.0 lb

## 2016-10-18 DIAGNOSIS — F902 Attention-deficit hyperactivity disorder, combined type: Secondary | ICD-10-CM

## 2016-10-18 MED ORDER — AMPHETAMINE-DEXTROAMPHETAMINE 30 MG PO TABS: 30.0000 mg | ORAL_TABLET | Freq: Three times a day (TID) | ORAL | 0 refills | Status: DC

## 2016-10-18 NOTE — Progress Notes (Signed)
   Subjective:    Patient ID: Jacob Bright, male    DOB: 10/04/1990, 26 y.o.   MRN: [id number]  HPI He is here for consultation concerning his ADD. There have been neck cell skin the prescriptions being written. He has been taking XR 25 mg however they are not giving him enough focus. He did do well on the 30 mg regular strength at taking it 3 times per day.. I will switch him back to the 3 times a day dosing.   Review of Systems     Objective:   Physical Exam Alert and in no distress otherwise not examined       Assessment & Plan:  ADHD (attention deficit hyperactivity disorder), combined type - Plan: amphetamine-dextroamphetamine (ADDERALL) 30 MG tablet, amphetamine-dextroamphetamine (ADDERALL) 30 MG tablet, amphetamine-dextroamphetamine (ADDERALL) 30 MG tablet He will bring his other prescriptions and so they can be destroyed. He does plan to use the medicine 3 times a day is needed since there are some days when he might need that dosing regimen.

## 2016-10-20 ENCOUNTER — Telehealth: Payer: Self-pay | Admitting: Family Medicine

## 2016-10-20 NOTE — Telephone Encounter (Signed)
P.A. ADDERALL  

## 2016-10-24 NOTE — Telephone Encounter (Signed)
P.A. Approved til 10/19/19, pt informed, faxed pharmacy

## 2016-11-16 ENCOUNTER — Other Ambulatory Visit: Payer: Self-pay | Admitting: Medical

## 2016-11-16 ENCOUNTER — Telehealth: Payer: Self-pay | Admitting: Family Medicine

## 2016-11-16 DIAGNOSIS — F902 Attention-deficit hyperactivity disorder, combined type: Secondary | ICD-10-CM

## 2016-11-16 MED ORDER — AMPHETAMINE-DEXTROAMPHETAMINE 30 MG PO TABS: 30.0000 mg | ORAL_TABLET | Freq: Three times a day (TID) | ORAL | 0 refills | Status: DC

## 2016-11-16 NOTE — Telephone Encounter (Signed)
Spoke with Vincenza HewsShane- he wanted me to clarify pt's fill history before providing 30 day supply to pt.   Upon further investigation into the chart, pt was asked on 10/18/2016 to return all remaining 25mg  scripts to the office to be destroyed. Patient did not return these scripts to the office.   Per Dr. Susann GivensLalonde- DO NOT give pt rewritten script for Adderall 30mg  by Vincenza HewsShane as per controlled substance report on patient and shows most recent fill history of October 24, 2016 of Adderall 30mg  tid #90, and also a more recent fill of Adderall XR 25mg  1 tablet daily #30.  Patient will have to wait until after he returns from vacation.   Called and questioned pt about the Adderall XR 25mg  script and pt states that is his "extra." He is unable to recall how many he has at home due to him being at work.   Pt was made aware of this and verbalized understanding that we can not refill scripts or write new ones until after his vacation.   Per chart review- Pt will not need any more written scripts of Adderall 30mg  until AFTER August 30th, 2018. He has enough Adderall 25mg  XR to last until 12/06/2016 and got last script of Adderall 30mg  filled on June 5th so this should last him until 11/23/2016.   Scripts written on 11/16/2016 were destroyed- witnessed by [place] and Shane.  Trixie Rude/RLB

## 2016-11-16 NOTE — Telephone Encounter (Signed)
30 day supply written.

## 2016-11-16 NOTE — Telephone Encounter (Signed)
Pt called and is requesting a rewritten rx  For his adderral he is going out of town and needs his rx filled earlier than the 30th he will bring the other written rx back. He would like to pick up today, pt can be reached at [phone] informed pt that dr Susann Givenslalonde was out of the office till next week

## 2016-11-24 DIAGNOSIS — F411 Generalized anxiety disorder: Secondary | ICD-10-CM | POA: Diagnosis not present

## 2016-11-27 NOTE — Telephone Encounter (Signed)
11/27/16 he does have enough Adderall 30 mg 3 times a day to last to the end of August. He does have backup XR prescription to help out. He has roughly 30 of these pills. He should not need a refill on the regular Adderall 3 times a day dosing until the end of August.

## 2016-11-27 NOTE — Telephone Encounter (Signed)
[facility] in [place] called with questions about pt's Adderall 25 XR script dated for 11/13/2016. Per Dr. Susann GivensLalonde- do not fill this. [facility] and asked her to destroy this prescription as it is one that pt was supposed to turn back into the office. Trixie Rude/RLB

## 2016-12-22 DIAGNOSIS — F411 Generalized anxiety disorder: Secondary | ICD-10-CM | POA: Diagnosis not present

## 2017-01-03 ENCOUNTER — Telehealth: Payer: Self-pay

## 2017-01-03 DIAGNOSIS — F902 Attention-deficit hyperactivity disorder, combined type: Secondary | ICD-10-CM

## 2017-01-03 MED ORDER — AMPHETAMINE-DEXTROAMPHETAMINE 30 MG PO TABS: 30.0000 mg | ORAL_TABLET | Freq: Three times a day (TID) | ORAL | 0 refills | Status: DC

## 2017-01-03 MED ORDER — AMPHETAMINE-DEXTROAMPHETAMINE 30 MG PO TABS: 30.0000 mg | ORAL_TABLET | Freq: Two times a day (BID) | ORAL | 0 refills | Status: DC

## 2017-01-03 NOTE — Addendum Note (Signed)
Addended by: Herminio CommonsJOHNSON, Yzabella Crunk A on: 01/03/2017 10:40 AM   Modules accepted: Orders

## 2017-01-03 NOTE — Telephone Encounter (Signed)
Ok. Please take care of this and I will sign.

## 2017-01-03 NOTE — Telephone Encounter (Signed)
Pt was notified to pick up rx

## 2017-01-03 NOTE — Telephone Encounter (Signed)
Have someone refill for 8/28,9/28 and 10/28

## 2017-01-03 NOTE — Telephone Encounter (Signed)
Patient called and is leaving Friday for school he needs his Adderall refilled first script to be dated for Friday can someone here write script

## 2017-03-05 ENCOUNTER — Telehealth: Payer: Self-pay | Admitting: Family Medicine

## 2017-03-05 MED ORDER — AMPHETAMINE-DEXTROAMPHET ER 25 MG PO CP24: 25.0000 mg | ORAL_CAPSULE | Freq: Two times a day (BID) | ORAL | 0 refills | Status: DC

## 2017-03-05 NOTE — Telephone Encounter (Signed)
Pt called and stated that he went to [place] and when he got home he realized he left all meds at hotel. He called hotel and they did not locate his meds. Pt is requesting another RX for Adderall. Pt can be reached at [phone].

## 2017-03-05 NOTE — Telephone Encounter (Signed)
He left his regular Adderall in [place] and has been unable to get it. He would like the XR 25 twice a day to cover until his next prescription is due which will be at the end of the month

## 2017-03-06 ENCOUNTER — Telehealth: Payer: Self-pay | Admitting: Family Medicine

## 2017-03-06 NOTE — Telephone Encounter (Signed)
Pt called and informed rx is ready.

## 2017-03-09 ENCOUNTER — Encounter: Payer: Self-pay | Admitting: Medical

## 2017-03-09 ENCOUNTER — Ambulatory Visit (INDEPENDENT_AMBULATORY_CARE_PROVIDER_SITE_OTHER): Payer: BLUE CROSS/BLUE SHIELD | Admitting: Medical

## 2017-03-09 VITALS — BP 110/72 | HR 95 | Wt 168.0 lb

## 2017-03-09 DIAGNOSIS — M545 Low back pain, unspecified: Secondary | ICD-10-CM

## 2017-03-09 DIAGNOSIS — W19XXXA Unspecified fall, initial encounter: Secondary | ICD-10-CM | POA: Diagnosis not present

## 2017-03-09 DIAGNOSIS — S93401A Sprain of unspecified ligament of right ankle, initial encounter: Secondary | ICD-10-CM

## 2017-03-09 DIAGNOSIS — S99911A Unspecified injury of right ankle, initial encounter: Secondary | ICD-10-CM

## 2017-03-09 MED ORDER — CYCLOBENZAPRINE HCL 10 MG PO TABS: 10.0000 mg | ORAL_TABLET | Freq: Every day | ORAL | 0 refills | Status: DC

## 2017-03-09 MED ORDER — HYDROCODONE-ACETAMINOPHEN 5-325 MG PO TABS: 1.0000 | ORAL_TABLET | Freq: Four times a day (QID) | ORAL | 0 refills | Status: DC | PRN

## 2017-03-09 NOTE — Progress Notes (Signed)
Subjective: Chief Complaint  Patient presents with  . Ankle Injury    swelling , ankle pain , used ice and advail , injury palying flag football   Here for injury.  yesterday afternoon was playing football, and slipped on muddy area.   His left foot slid causing him to lose balance and feeling a pull in his low back.   At the same time another player landed on his right ankle.  He felt to the ground.   Had pain in right ankle and left low back.   He continues to have pain and stiffness in left low back.  He has pain in right ankle, hurts to bear weight.  He notes a hx/o repetitive injury to the right ankle, prior soccer injuries, and had reconstruction surgery of the right ankle in the past.  He also has hx/o hip injury, fracture of hip in remote past.   Using tylenol for pain, ibuprofen 600mg  a well.     Past Medical History:  Diagnosis Date  . Chronic tonsillitis 09/2014  . Exercise-induced asthma    no current med.  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 amphetamine-dextroamphetamine (ADDERALL XR) 25 MG 24 hr capsule Take 1 capsule by mouth 2 times daily at 12 noon and 4 pm. 30 capsule 0  . amphetamine-dextroamphetamine (ADDERALL) 30 MG tablet Take 1 tablet by mouth 3 (three) times daily. (Patient not taking: Reported on 03/09/2017) 90 tablet 0  . amphetamine-dextroamphetamine (ADDERALL) 30 MG tablet Take 1 tablet by mouth 3 (three) times daily. (Patient not taking: Reported on 03/09/2017) 90 tablet 0  . [START ON 03/18/2017] amphetamine-dextroamphetamine (ADDERALL) 30 MG tablet Take 1 tablet by mouth 3 (three) times daily. (Patient not taking: Reported on 03/09/2017) 90 tablet 0   No current facility-administered medications on file prior to visit.    Past Surgical History:  Procedure Laterality Date  . ANKLE ARTHROSCOPY WITH RECONSTRUCTION Right 03/14/2013   Procedure:  ANKLE ARTHROSCOPY WITH RECONSTRUCTION;  Surgeon: Nadara Mustard, MD;  Location: [facility] OR;  Service: Orthopedics;  Laterality: Right;  Right Ankle Gould Modified Brostrom Reconstruction with Ankle Arthroscopy  . NASAL SEPTOPLASTY W/ TURBINOPLASTY  05/10/2010   bilateral inferior turbinate reduction  . TONSILLECTOMY N/A 10/26/2014   Procedure: TONSILLECTOMY;  Surgeon: Christia Reading, MD;  Location: [facility];  Service: ENT;  Laterality: N/A;  . TYMPANOSTOMY TUBE PLACEMENT     ROS as in subjective   Objective: BP 110/72   Pulse 95   Wt 168 lb (76.2 kg)   SpO2 99%   BMI 21.57 kg/m   Gen: wd, wn, nad, tall white male Skin: unremarkable of back or ankle Back: tender left lumbar paraspinal region, mild tenderness with back flexion and extension which is mildly reduced due to pain No other back deformity Tender over right anterolateral ankle and ATFL.  Pain in same area with ROM in general which is 80% of normal The lateral malleolus is somewhat laterally deviated s/p ankle surgery, there is foot eversion chronically as well.  otherwise foot nontender, no laxity, no swelling, no bruising Legs neurovascularly intact    Assessment: Encounter Diagnoses  Name Primary?  Marland Kitchen Ankle injuries, right, initial encounter Yes  . Acute left-sided low back pain without sciatica   . Sprain of right ankle, unspecified ligament, initial encounter   . Fall, initial encounter  Plan: Advised that exam and symptoms suggest right ankle sprain, mild to moderate and lumbar strain and spasm injury.     Advised hot towel or heat pad to low back, but use ice water bath to ankle 20min BID - TID  C/t ibuprofen and tylenol OTC q6 hours but Norco short term for worse pain.  Use trial of flexeril the next few nights prn.    Advised rest over the weekend, gentle stretching.   F/u with Dr. Susann GivensLalonde next week for recheck  Roe CoombsDon was seen today for ankle injury.  Diagnoses and all orders for this  visit:  Ankle injuries, right, initial encounter  Acute left-sided low back pain without sciatica  Sprain of right ankle, unspecified ligament, initial encounter  Fall, initial encounter  Other orders -     cyclobenzaprine (FLEXERIL) 10 MG tablet; Take 1 tablet (10 mg total) by mouth at bedtime. -     HYDROcodone-acetaminophen (NORCO) 5-325 MG tablet; Take 1 tablet by mouth every 6 (six) hours as needed for moderate pain.

## 2017-03-30 ENCOUNTER — Telehealth: Payer: Self-pay | Admitting: Family Medicine

## 2017-03-30 NOTE — Telephone Encounter (Signed)
Pt scheduled for Monday. Trixie Rude/RLB

## 2017-03-30 NOTE — Telephone Encounter (Signed)
Have him come in for an appointment.  

## 2017-03-30 NOTE — Telephone Encounter (Signed)
Patient asked to have you call him about his adderral rx  States it is not having same effect, needs to change something. Not able to stay as focused as before

## 2017-04-02 ENCOUNTER — Ambulatory Visit: Payer: BLUE CROSS/BLUE SHIELD | Admitting: Family Medicine

## 2017-04-02 ENCOUNTER — Encounter: Payer: Self-pay | Admitting: Family Medicine

## 2017-04-02 VITALS — BP 110/70 | HR 84 | Resp 16 | Ht 74.0 in | Wt 175.2 lb

## 2017-04-02 DIAGNOSIS — F902 Attention-deficit hyperactivity disorder, combined type: Secondary | ICD-10-CM

## 2017-04-02 MED ORDER — LISDEXAMFETAMINE DIMESYLATE 60 MG PO CAPS: 60.0000 mg | ORAL_CAPSULE | ORAL | 0 refills | Status: DC

## 2017-04-02 NOTE — Progress Notes (Signed)
   Subjective:    Patient ID: Jacob Bright, male    DOB: [birth year], 26 y.o.   MRN: [id number]  HPI He is here for consult concerning his ADHD.  He has been on 3 times daily dosing of Adderall and sometimes had to take 1-1/2 pills in the morning to get a good benefit.  He has been taking them on an empty stomach and there written as d.a.w.  He recently did try on Vyvanse that his sister is taking and states that that worked well for him and lasted longer.  Review of Systems     Objective:   Physical Exam    Alert and in no distress otherwise not examined       Assessment & Plan:  ADHD (attention deficit hyperactivity disorder), combined type - Plan: lisdexamfetamine (VYVANSE) 60 MG capsule After discussion with him, I will have him try Vyvanse.  He will let me know if it works, how long and if he has any troubles.

## 2017-04-10 ENCOUNTER — Other Ambulatory Visit: Payer: Self-pay

## 2017-04-10 ENCOUNTER — Telehealth: Payer: Self-pay | Admitting: Family Medicine

## 2017-04-10 DIAGNOSIS — F902 Attention-deficit hyperactivity disorder, combined type: Secondary | ICD-10-CM

## 2017-04-10 MED ORDER — AMPHETAMINE-DEXTROAMPHETAMINE 30 MG PO TABS: 30.0000 mg | ORAL_TABLET | Freq: Three times a day (TID) | ORAL | 0 refills | Status: DC

## 2017-04-10 NOTE — Telephone Encounter (Signed)
Per [employer] [facility] [employer]- refer to Dr. Marisue BrooklynAmy Stevenson for ADHD management. Trixie Rude/RLB

## 2017-04-10 NOTE — Telephone Encounter (Signed)
He states that the Vyvanse was not really working and causing some anxiety.  He would like to go back on his previous dosing of Adderall.  I will rewrite for that in probably refer for further evaluation and adjustment of his medication.

## 2017-04-10 NOTE — Telephone Encounter (Signed)
Pt called and stated that Vyvance in not working for him. He also states he thinks it is giving him Gas. Pt would like to back on previous medication. Pt can be reached [phone].

## 2017-04-11 NOTE — Telephone Encounter (Signed)
Amy Elisabeth MostStevenson office is closed due to holidays- will call after Thanksgiving.Trixie Rude/RLB

## 2017-04-18 NOTE — Telephone Encounter (Signed)
Referral faxed to Dr. Elisabeth MostStevenson office. LM to make pt aware. Trixie Rude/RLB

## 2017-04-27 ENCOUNTER — Telehealth: Payer: Self-pay | Admitting: Family Medicine

## 2017-04-27 DIAGNOSIS — F902 Attention-deficit hyperactivity disorder, combined type: Secondary | ICD-10-CM

## 2017-04-27 MED ORDER — AMPHETAMINE-DEXTROAMPHETAMINE 30 MG PO TABS: 30.0000 mg | ORAL_TABLET | Freq: Three times a day (TID) | ORAL | 0 refills | Status: DC

## 2017-04-27 NOTE — Telephone Encounter (Signed)
He states that when he takes the Adderall on an empty stomach he gets the appropriate level of focus and would like to continue on that.  He will hold off on seeing the specialist until beginning of the year.

## 2017-04-27 NOTE — Telephone Encounter (Signed)
Pt called and would like to talk to you about there referral you made for him to go to the attention specialist, he can be reached at [phone]

## 2017-05-01 ENCOUNTER — Telehealth: Payer: Self-pay | Admitting: Family Medicine

## 2017-05-01 DIAGNOSIS — F902 Attention-deficit hyperactivity disorder, combined type: Secondary | ICD-10-CM

## 2017-05-01 MED ORDER — AMPHETAMINE-DEXTROAMPHETAMINE 30 MG PO TABS: 30.0000 mg | ORAL_TABLET | Freq: Three times a day (TID) | ORAL | 0 refills | Status: DC

## 2017-05-01 NOTE — Telephone Encounter (Signed)
[place] is stuck [place].  [facility] [address] [place].  THEY CLOSE AT 5 pm today.  [place] [facility]

## 2017-05-01 NOTE — Telephone Encounter (Signed)
[facility] [place].  It was previously sent to a story [place] and he cannot get into town to pick it up.

## 2017-05-03 ENCOUNTER — Telehealth: Payer: Self-pay | Admitting: Family Medicine

## 2017-05-03 MED ORDER — AMPHETAMINE-DEXTROAMPHETAMINE 30 MG PO TABS: 30.0000 mg | ORAL_TABLET | Freq: Three times a day (TID) | ORAL | 0 refills | Status: DC
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
## 2017-05-04 DIAGNOSIS — F411 Generalized anxiety disorder: Secondary | ICD-10-CM | POA: Diagnosis not present

## 2017-05-09 DIAGNOSIS — F411 Generalized anxiety disorder: Secondary | ICD-10-CM | POA: Diagnosis not present

## 2017-05-17 ENCOUNTER — Telehealth: Payer: Self-pay

## 2017-05-17 NOTE — Telephone Encounter (Signed)
Pt was referred to [place] [facility] (Amy Elisabeth MostStevenson) as directed by Dr. Susann GivensLalonde, but they have tried to reach pt with no return call to schedule appt. Trixie Rude/RLB

## 2017-05-28 ENCOUNTER — Ambulatory Visit (INDEPENDENT_AMBULATORY_CARE_PROVIDER_SITE_OTHER): Payer: BLUE CROSS/BLUE SHIELD | Admitting: Physician Assistant

## 2017-05-28 ENCOUNTER — Encounter: Payer: Self-pay | Admitting: Physician Assistant

## 2017-05-28 VITALS — BP 127/73 | HR 117 | Temp 97.9°F | Resp 20 | Ht 72.0 in | Wt 172.0 lb

## 2017-05-28 DIAGNOSIS — R0602 Shortness of breath: Secondary | ICD-10-CM

## 2017-05-28 DIAGNOSIS — J4521 Mild intermittent asthma with (acute) exacerbation: Secondary | ICD-10-CM | POA: Diagnosis not present

## 2017-05-28 MED ORDER — ALBUTEROL SULFATE (2.5 MG/3ML) 0.083% IN NEBU
2.5000 mg | INHALATION_SOLUTION | Freq: Once | RESPIRATORY_TRACT | Status: AC
  Administered 2017-05-28: 2.5 mg via RESPIRATORY_TRACT

## 2017-05-28 MED ORDER — ALBUTEROL SULFATE HFA 108 (90 BASE) MCG/ACT IN AERS: 2.0000 | INHALATION_SPRAY | Freq: Four times a day (QID) | RESPIRATORY_TRACT | 0 refills | Status: DC | PRN

## 2017-05-28 MED ORDER — PREDNISONE 20 MG PO TABS: ORAL_TABLET | ORAL | 0 refills | Status: AC

## 2017-05-28 MED ORDER — IPRATROPIUM BROMIDE 0.02 % IN SOLN
0.5000 mg | Freq: Once | RESPIRATORY_TRACT | Status: AC
  Administered 2017-05-28: 0.5 mg via RESPIRATORY_TRACT

## 2017-05-28 NOTE — Progress Notes (Signed)
05/29/2017 9:04 AM   DOB: 12/08/1990 / MRN: [id number]  SUBJECTIVE:  Jacob Bright is a 27 y.o. male presenting for cough and SOB with chest tightness.  He has a history of asthma. This has been going on now for about 3 days and patient notes that in the last 24 hours, in particular, his wheezing and chest tightness seemed to become progressively severe.  He has noticed some worsening today.  He does not have access to any albuterol outpatient at this time. He has had some nasal congestion prior to this illness.   He has No Known Allergies.   He  has a past medical history of Chronic tonsillitis (09/2014), Exercise-induced asthma, and Spine misalignment.    He  reports that  has never smoked. His smokeless tobacco use includes chew. He reports that he does not drink alcohol or use drugs. He  has no sexual activity history on file. The patient  has a past surgical history that includes Tympanostomy tube placement; Ankle arthroscopy with reconstruction (Right, 03/14/2013); Nasal septoplasty w/ turbinoplasty (05/10/2010); and Tonsillectomy (N/A, 10/26/2014).  His family history includes Hypertension in his father.  Review of Systems  Constitutional: Negative for chills and fever.  Respiratory: Positive for cough, shortness of breath and wheezing. Negative for hemoptysis and sputum production.   Cardiovascular: Positive for chest pain (tightness). Negative for leg swelling.  Gastrointestinal: Negative for nausea.  Skin: Negative for itching and rash.  Neurological: Negative for dizziness.    The problem list and medications were reviewed and updated by myself where necessary and exist elsewhere in the encounter.   OBJECTIVE:  BP 127/73   Pulse (!) 117   Temp 97.9 F (36.6 C)   Resp 20   Ht 6' (1.829 m)   Wt 172 lb (78 kg)   SpO2 93%   BMI 23.33 kg/m   Physical Exam  Constitutional: He appears well-developed. He is active and cooperative.  Non-toxic appearance.  He appears fatigued.    Cardiovascular: Regular rhythm, S1 normal, S2 normal, normal heart sounds, intact distal pulses and normal pulses. Tachycardia present. Exam reveals no gallop and no friction rub.  No murmur heard. Pulmonary/Chest: No stridor. No tachypnea. He is in respiratory distress (noted tripoding and gasping for breath preneb, resting comfortably post neb.). He has wheezes (resolved post neb). He has no rales.  Abdominal: He exhibits no distension.  Musculoskeletal: He exhibits no edema.  Neurological: He is alert.  Skin: Skin is warm and dry. He is not diaphoretic. No pallor.  Vitals reviewed.    No results found for this or any previous visit (from the past 72 hour(s)).  No results found.  ASSESSMENT AND PLAN:  Jacob Bright was seen today for shortness of breath.  Diagnoses and all orders for this visit:  Shortness of breath: Post neb HR ~95 and sats ~99. Near complete resolution of symptoms with one duoneb.  Given that he describes a precipitous deterioration over the last 24 hours I am concerned that his asthma may rebound without oral pred.  I have discussed the risk and benefits of PO pred with him and he prefers to start po therapy and will also pick up his albuterol inhaler.  He will come back or seek care elsewhere if his breathing deteriorates.  -     albuterol (PROVENTIL) (2.5 MG/3ML) 0.083% nebulizer solution 2.5 mg -     ipratropium (ATROVENT) nebulizer solution 0.5 mg  Mild intermittent asthma with acute exacerbation -  predniSONE (DELTASONE) 20 MG tablet; Take 3 in the morning for 3 days, then 2 in the morning for 3 days, and then 1 in the morning for 3 days. -     albuterol (PROVENTIL HFA;VENTOLIN HFA) 108 (90 Base) MCG/ACT inhaler; Inhale 2 puffs into the lungs every 6 (six) hours as needed for wheezing or shortness of breath.    The patient is advised to call or return to clinic if he does not see an improvement in symptoms, or to seek the care of the closest emergency department  if he worsens with the above plan.   Deliah BostonMichael Dealva Lafoy, MHS, PA-C [facility] 05/29/2017 9:04 AM

## 2017-05-28 NOTE — Patient Instructions (Signed)
     IF you received an x-ray today, you will receive an invoice from [facility]. Please contact [facility] at [phone] with questions or concerns regarding your invoice.   IF you received labwork today, you will receive an invoice from [facility]. Please contact [facility] at [phone] with questions or concerns regarding your invoice.   Our billing staff will not be able to assist you with questions regarding bills from these companies.  You will be contacted with the lab results as soon as they are available. The fastest way to get your results is to activate your My Chart account. Instructions are located on the last page of this paperwork. If you have not heard from us regarding the results in 2 weeks, please contact this office.     

## 2017-05-29 DIAGNOSIS — F411 Generalized anxiety disorder: Secondary | ICD-10-CM | POA: Diagnosis not present

## 2017-06-01 ENCOUNTER — Telehealth: Payer: Self-pay | Admitting: Family Medicine

## 2017-06-01 MED ORDER — AMPHETAMINE-DEXTROAMPHETAMINE 30 MG PO TABS: 30.0000 mg | ORAL_TABLET | Freq: Three times a day (TID) | ORAL | 0 refills | Status: DC

## 2017-06-01 NOTE — Telephone Encounter (Signed)
ALERT PT HAS DIFFERENT PHARM[facility] pt called for refills of Adderall 30 mg. Pt states he is requesting early because a prior authorization will be needed. Please [facility] [address] [place]. Pt can be reached at [phone].

## 2017-06-19 ENCOUNTER — Other Ambulatory Visit: Payer: Self-pay | Admitting: Family Medicine

## 2017-06-19 ENCOUNTER — Telehealth: Payer: Self-pay | Admitting: Family Medicine

## 2017-06-19 MED ORDER — AMPHETAMINE-DEXTROAMPHETAMINE 30 MG PO TABS: 30.0000 mg | ORAL_TABLET | Freq: Three times a day (TID) | ORAL | 0 refills | Status: DC

## 2017-06-19 NOTE — Telephone Encounter (Signed)
Let him know that this will be the last time I will do this
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
## 2017-06-19 NOTE — Telephone Encounter (Signed)
Dr. Susann GivensLalonde has already done this

## 2017-07-02 ENCOUNTER — Telehealth: Payer: Self-pay | Admitting: Family Medicine

## 2017-07-02 NOTE — Telephone Encounter (Signed)
Forwarding to kathy  

## 2017-07-02 NOTE — Telephone Encounter (Signed)
ALERT [facility] pt called for refills of Adderall 30 mg. Please [facility] [address] [facility] [place]. Pt can be reached at [phone].

## 2017-07-02 NOTE — Telephone Encounter (Signed)
He already has a prescription on file for February 11 and March 11

## 2017-07-20 ENCOUNTER — Telehealth: Payer: Self-pay | Admitting: Family Medicine

## 2017-07-20 MED ORDER — AMPHETAMINE-DEXTROAMPHETAMINE 30 MG PO TABS: 30.0000 mg | ORAL_TABLET | Freq: Three times a day (TID) | ORAL | 0 refills | Status: DC

## 2017-07-20 NOTE — Telephone Encounter (Signed)
Pt called and stated he is going [place] [employer]. He is leaving Tuesday and will run out of his meds while out of the country. He is requesting refill of Adderall be sent in for him. He realizes insurance will not pay for. [facility] [address] [facility] [place]. Pt can be reached at [phone].

## 2017-08-04 DIAGNOSIS — W19XXXA Unspecified fall, initial encounter: Secondary | ICD-10-CM | POA: Diagnosis not present

## 2017-08-04 DIAGNOSIS — M545 Low back pain: Secondary | ICD-10-CM | POA: Diagnosis not present

## 2017-08-07 DIAGNOSIS — S39012A Strain of muscle, fascia and tendon of lower back, initial encounter: Secondary | ICD-10-CM | POA: Diagnosis not present

## 2017-08-07 DIAGNOSIS — M545 Low back pain: Secondary | ICD-10-CM | POA: Diagnosis not present

## 2017-08-15 ENCOUNTER — Telehealth: Payer: Self-pay | Admitting: Family Medicine

## 2017-08-15 MED ORDER — AMPHETAMINE-DEXTROAMPHETAMINE 30 MG PO TABS: 30.0000 mg | ORAL_TABLET | Freq: Three times a day (TID) | ORAL | 0 refills | Status: DC

## 2017-08-15 NOTE — Telephone Encounter (Signed)
Pt verbalized [facility] and to [facility] to [facility] in [place], [place]

## 2017-08-15 NOTE — Telephone Encounter (Signed)
Pt called and is requesting a refill on his adderall [id number] [place], [place] - [address] pt can be reached at [phone]

## 2017-08-15 NOTE — Telephone Encounter (Signed)
I refilled his Adderall to a different drugstore as the previous one was out of stock.  I will give him 1 month supply then touch base is on where he wants the other meds sent to.

## 2017-09-11 ENCOUNTER — Telehealth: Payer: Self-pay | Admitting: Family Medicine

## 2017-09-11 MED ORDER — AMPHETAMINE-DEXTROAMPHETAMINE 30 MG PO TABS: 30.0000 mg | ORAL_TABLET | Freq: Three times a day (TID) | ORAL | 0 refills | Status: DC

## 2017-09-11 NOTE — Telephone Encounter (Signed)
Pt called for refills of adderall. [facility] [address] [place]. Pt can be reached at [phone]

## 2017-10-05 ENCOUNTER — Other Ambulatory Visit: Payer: Self-pay | Admitting: Family Medicine

## 2017-10-05 MED ORDER — AMPHETAMINE-DEXTROAMPHETAMINE 30 MG PO TABS: 30.0000 mg | ORAL_TABLET | Freq: Three times a day (TID) | ORAL | 0 refills | Status: DC

## 2017-10-05 NOTE — Progress Notes (Signed)
He is moving back to [place] and lost his Adderall prescription.  The ones in the future were for May 27 June 27.  I will change him to today and June 17.

## 2017-11-28 ENCOUNTER — Telehealth: Payer: Self-pay | Admitting: Family Medicine

## 2017-11-28 ENCOUNTER — Other Ambulatory Visit: Payer: Self-pay | Admitting: Medical

## 2017-11-28 MED ORDER — AMPHETAMINE-DEXTROAMPHETAMINE 30 MG PO TABS: 30.0000 mg | ORAL_TABLET | Freq: Three times a day (TID) | ORAL | 0 refills | Status: DC

## 2017-11-28 NOTE — Telephone Encounter (Signed)
Pt needs refill Adderall 30mg  #90 to New pharmacy [facility] [address],  States moved and never got the last Adderall in [place], so that one can be cancelled

## 2017-11-28 NOTE — Telephone Encounter (Signed)
Med sent.   May be due for med check, last visit 03/2017.  Dr. Susann GivensLalonde is out of town currently

## 2017-12-11 NOTE — Telephone Encounter (Signed)
When does pt need next appt?

## 2017-12-12 NOTE — Telephone Encounter (Signed)
I am fine with waiting until this fall to see him again

## 2017-12-17 ENCOUNTER — Telehealth: Payer: Self-pay | Admitting: Family Medicine

## 2017-12-17 NOTE — Telephone Encounter (Signed)
[facility] [address] and see if they will fill his Adderall sooner

## 2017-12-17 NOTE — Telephone Encounter (Signed)
Jacob Bright is saying that they can not fill pt adderall it is to soon . And if he is moving we should [facility] is [facility]ating please advised. KH

## 2017-12-17 NOTE — Telephone Encounter (Signed)
Pt request you call him as he has to move in a couple of days for a new job and wants to talk with you regarding some medical stuff.  [phone]

## 2017-12-18 NOTE — Telephone Encounter (Signed)
I called and told him to give me the number of the drugstore and the name when he gets to his new employment

## 2017-12-20 ENCOUNTER — Other Ambulatory Visit: Payer: Self-pay | Admitting: Family Medicine

## 2017-12-20 ENCOUNTER — Telehealth: Payer: Self-pay

## 2017-12-20 MED ORDER — AMPHETAMINE-DEXTROAMPHETAMINE 30 MG PO TABS: 30.0000 mg | ORAL_TABLET | Freq: Three times a day (TID) | ORAL | 0 refills | Status: DC

## 2017-12-20 NOTE — Progress Notes (Signed)
I wrote him a prescription dated today for one that should have been filled on August 10.  I explained that the pharmacy might not fill it early.  [place] is moving to [place].  I explained that eventually we will have to call prescriptions via the computer.

## 2017-12-20 NOTE — Telephone Encounter (Signed)
Pt called stating he has a family friend whom is a pharmacist that told him he would try to fill the Adderall prescription for him. He is not sure of the name of the pharmacy but he's on the way here. He stated he would need a hard copy for his friend to fill the prescription and if the family friend can't do it he will take the prescription to [place] with him. Please advise

## 2017-12-26 ENCOUNTER — Telehealth: Payer: Self-pay

## 2017-12-26 NOTE — Telephone Encounter (Signed)
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 He stated he will be in in the morning to get the letter. Please advise.

## 2017-12-26 NOTE — Telephone Encounter (Signed)
Please write a letter.

## 2017-12-27 ENCOUNTER — Encounter: Payer: Self-pay | Admitting: Family Medicine

## 2017-12-27 NOTE — Telephone Encounter (Signed)
Letter has been written and given to patient.

## 2018-01-09 ENCOUNTER — Telehealth: Payer: Self-pay | Admitting: Family Medicine

## 2018-01-09 MED ORDER — AMPHETAMINE-DEXTROAMPHETAMINE 30 MG PO TABS: 30.0000 mg | ORAL_TABLET | Freq: Three times a day (TID) | ORAL | 0 refills | Status: DC

## 2018-01-09 NOTE — Telephone Encounter (Signed)
Pt requesting Adderall 30 mg refill to [facility] AT [address], [place], [place].  Pt is working out of town in [place] and will be at this location for about 3days then will be traveling around to several places for work. He never received the August 2019 Adderall script that was sent to [place], [place] because he was no longer there.

## 2018-01-11 ENCOUNTER — Telehealth: Payer: Self-pay | Admitting: Family Medicine

## 2018-01-11 NOTE — Telephone Encounter (Signed)
Check with the drugstore that I sent the Adderall prescription to and find out if it indeed is on back order.  If it is not on back order find out if he filled it

## 2018-01-11 NOTE — Telephone Encounter (Signed)
Pt called back and states that the [facility] and they do not know when they will get it [facility] [address], [place], [place] [id number]  pt can be reached at [phone]

## 2018-01-11 NOTE — Telephone Encounter (Signed)
I am aware that the patient said that but I wanted you to check with the drugstore to verify that

## 2018-01-11 NOTE — Telephone Encounter (Signed)
Pt says all the [facility] are [facility] and [facility] [address] [place] . Thanks [employer]

## 2018-01-12 ENCOUNTER — Other Ambulatory Visit: Payer: Self-pay | Admitting: Family Medicine

## 2018-01-12 MED ORDER — AMPHETAMINE-DEXTROAMPHET ER 25 MG PO CP24: 25.0000 mg | ORAL_CAPSULE | ORAL | 0 refills | Status: DC

## 2018-01-12 MED ORDER — AMPHETAMINE-DEXTROAMPHETAMINE 30 MG PO TABS: 30.0000 mg | ORAL_TABLET | Freq: Three times a day (TID) | ORAL | 0 refills | Status: DC

## 2018-01-15 NOTE — Telephone Encounter (Signed)
Pt picked up Adderall xr 25 mg 01-12-18 .

## 2018-01-15 NOTE — Telephone Encounter (Signed)
Awaiting a call back for med question kh

## 2018-01-17 ENCOUNTER — Other Ambulatory Visit: Payer: Self-pay | Admitting: Family Medicine

## 2018-01-17 MED ORDER — AMPHETAMINE-DEXTROAMPHETAMINE 20 MG PO TABS: 20.0000 mg | ORAL_TABLET | Freq: Three times a day (TID) | ORAL | 0 refills | Status: DC

## 2018-01-17 MED ORDER — AMPHETAMINE-DEXTROAMPHET ER 25 MG PO CP24: 25.0000 mg | ORAL_CAPSULE | Freq: Two times a day (BID) | ORAL | 0 refills | Status: DC

## 2018-01-18 MED ORDER — AMPHETAMINE-DEXTROAMPHETAMINE 20 MG PO TABS: 20.0000 mg | ORAL_TABLET | Freq: Three times a day (TID) | ORAL | 0 refills | Status: DC

## 2018-02-04 ENCOUNTER — Telehealth: Payer: Self-pay | Admitting: Family Medicine
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
## 2018-02-04 NOTE — Telephone Encounter (Signed)
Pharmacist [facility]Justin [facility] called & needs to speak with you regarding prescriptions for patient.  Said I could not help him, that he needed to speak with the prescribing doctor. Please call t# [phone]

## 2018-02-25 MED ORDER — AMPHETAMINE-DEXTROAMPHETAMINE 20 MG PO TABS: 20.0000 mg | ORAL_TABLET | Freq: Three times a day (TID) | ORAL | 0 refills | Status: DC

## 2018-02-26 ENCOUNTER — Other Ambulatory Visit: Payer: Self-pay | Admitting: Family Medicine

## 2018-02-26 MED ORDER — AMPHETAMINE-DEXTROAMPHET ER 30 MG PO CP24: 30.0000 mg | ORAL_CAPSULE | Freq: Every day | ORAL | 0 refills | Status: DC

## 2018-02-26 NOTE — Progress Notes (Signed)
Apparently different strengths of Adderall are on back order.  I discussed this with the pharmacist and I will give him XR 30 mg.

## 2018-03-10 ENCOUNTER — Other Ambulatory Visit: Payer: Self-pay | Admitting: Family Medicine

## 2018-03-10 MED ORDER — AMPHETAMINE-DEXTROAMPHET ER 30 MG PO CP24: 30.0000 mg | ORAL_CAPSULE | Freq: Two times a day (BID) | ORAL | 0 refills | Status: DC

## 2018-03-10 NOTE — Progress Notes (Signed)
He states that the Adderall XR 30 last roughly 6 hours.  I will go ahead and give him twice a day dosing on that.  He does state that the Adderall 30 -3 times daily dosing does seem to be better but that has been hard to get.

## 2018-04-07 ENCOUNTER — Other Ambulatory Visit: Payer: Self-pay | Admitting: Family Medicine

## 2018-04-07 MED ORDER — AMPHETAMINE-DEXTROAMPHET ER 30 MG PO CP24: 30.0000 mg | ORAL_CAPSULE | Freq: Two times a day (BID) | ORAL | 0 refills | Status: DC

## 2018-04-08 ENCOUNTER — Telehealth: Payer: Self-pay

## 2018-04-08 NOTE — Telephone Encounter (Signed)
[facility]. States the maximum daily dose should be 1x daily. Please advise.

## 2018-04-08 NOTE — Telephone Encounter (Signed)
This requires a prior [place]

## 2018-04-08 NOTE — Telephone Encounter (Signed)
The maximum daily dose for Adderall should be 1x daily.

## 2018-04-11 ENCOUNTER — Telehealth: Payer: Self-pay | Admitting: Family Medicine

## 2018-04-11 NOTE — Telephone Encounter (Signed)
P.A. ADDERALL XR 2 a day dosing, pt already had existing P.A., called plan t# [phone] ID#[phone] & they advised pt had a plan change so another P.A. Needed, they were unable to do by phone, had to complete by fax.

## 2018-04-13 NOTE — Telephone Encounter (Signed)
P.A. Completed and approved, must be ran as brand

## 2018-04-22 ENCOUNTER — Other Ambulatory Visit: Payer: Self-pay | Admitting: Family Medicine

## 2018-04-22 MED ORDER — AMPHETAMINE-DEXTROAMPHET ER 30 MG PO CP24: 30.0000 mg | ORAL_CAPSULE | Freq: Two times a day (BID) | ORAL | 0 refills | Status: DC

## 2018-08-06 ENCOUNTER — Other Ambulatory Visit: Payer: Self-pay | Admitting: Family Medicine

## 2018-08-06 DIAGNOSIS — F902 Attention-deficit hyperactivity disorder, combined type: Secondary | ICD-10-CM

## 2018-08-06 MED ORDER — AMPHETAMINE-DEXTROAMPHETAMINE 30 MG PO TABS: 30.0000 mg | ORAL_TABLET | Freq: Three times a day (TID) | ORAL | 0 refills | Status: DC

## 2018-08-06 NOTE — Progress Notes (Signed)
He called for refill on his Adderall.  He would like to 30 mg regular dosing as it does seem to work better for him.  In the past we had difficulty getting various formulations of Adderall.  He is now in a new location I will call him for the 3 times daily dosing.

## 2018-08-09 ENCOUNTER — Other Ambulatory Visit: Payer: Self-pay

## 2018-08-09 ENCOUNTER — Ambulatory Visit (INDEPENDENT_AMBULATORY_CARE_PROVIDER_SITE_OTHER): Payer: BLUE CROSS/BLUE SHIELD | Admitting: Family Medicine

## 2018-08-09 VITALS — BP 108/70 | HR 107 | Temp 98.0°F | Wt 169.2 lb

## 2018-08-09 DIAGNOSIS — F902 Attention-deficit hyperactivity disorder, combined type: Secondary | ICD-10-CM | POA: Diagnosis not present

## 2018-08-09 DIAGNOSIS — S39012A Strain of muscle, fascia and tendon of lower back, initial encounter: Secondary | ICD-10-CM | POA: Diagnosis not present

## 2018-08-09 MED ORDER — HYDROCODONE-ACETAMINOPHEN 5-325 MG PO TABS: 1.0000 | ORAL_TABLET | Freq: Four times a day (QID) | ORAL | 0 refills | Status: DC | PRN

## 2018-08-09 MED ORDER — CARISOPRODOL 350 MG PO TABS: 350.0000 mg | ORAL_TABLET | Freq: Three times a day (TID) | ORAL | 0 refills | Status: DC | PRN
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
## 2018-08-26 ENCOUNTER — Other Ambulatory Visit: Payer: Self-pay | Admitting: Family Medicine

## 2018-08-26 MED ORDER — AMPHETAMINE-DEXTROAMPHETAMINE 30 MG PO TABS: 30.0000 mg | ORAL_TABLET | Freq: Three times a day (TID) | ORAL | 0 refills | Status: DC
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
## 2018-09-07 DIAGNOSIS — L720 Epidermal cyst: Secondary | ICD-10-CM | POA: Diagnosis not present

## 2018-09-23 ENCOUNTER — Other Ambulatory Visit: Payer: Self-pay | Admitting: Family Medicine

## 2018-09-23 MED ORDER — AMPHETAMINE-DEXTROAMPHETAMINE 30 MG PO TABS: 30.0000 mg | ORAL_TABLET | Freq: Three times a day (TID) | ORAL | 0 refills | Status: DC

## 2018-10-26 ENCOUNTER — Other Ambulatory Visit: Payer: Self-pay | Admitting: Family Medicine

## 2018-10-26 MED ORDER — AMPHETAMINE-DEXTROAMPHETAMINE 30 MG PO TABS: 30.0000 mg | ORAL_TABLET | Freq: Three times a day (TID) | ORAL | 0 refills | Status: DC

## 2018-11-21 ENCOUNTER — Other Ambulatory Visit: Payer: Self-pay | Admitting: Family Medicine

## 2018-11-21 ENCOUNTER — Telehealth: Payer: Self-pay | Admitting: Family Medicine

## 2018-11-21 MED ORDER — AMPHETAMINE-DEXTROAMPHETAMINE 30 MG PO TABS: 30.0000 mg | ORAL_TABLET | Freq: Three times a day (TID) | ORAL | 0 refills | Status: DC

## 2018-11-21 NOTE — Telephone Encounter (Signed)
[facility] and [facility] ok fill early Adderall per Dr. Redmond School and spoke with [facility]

## 2018-12-20 ENCOUNTER — Other Ambulatory Visit: Payer: Self-pay | Admitting: Family Medicine

## 2018-12-20 MED ORDER — AMPHETAMINE-DEXTROAMPHETAMINE 30 MG PO TABS: 30.0000 mg | ORAL_TABLET | Freq: Three times a day (TID) | ORAL | 0 refills | Status: DC

## 2019-01-16 ENCOUNTER — Other Ambulatory Visit: Payer: Self-pay | Admitting: Family Medicine

## 2019-01-16 MED ORDER — AMPHETAMINE-DEXTROAMPHETAMINE 30 MG PO TABS: 30.0000 mg | ORAL_TABLET | Freq: Three times a day (TID) | ORAL | 0 refills | Status: DC
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
## 2019-02-16 ENCOUNTER — Other Ambulatory Visit: Payer: Self-pay | Admitting: Family Medicine

## 2019-02-16 MED ORDER — AMPHETAMINE-DEXTROAMPHETAMINE 30 MG PO TABS: 30.0000 mg | ORAL_TABLET | Freq: Three times a day (TID) | ORAL | 0 refills | Status: DC

## 2019-03-16 ENCOUNTER — Other Ambulatory Visit: Payer: Self-pay | Admitting: Family Medicine

## 2019-03-16 MED ORDER — AMPHETAMINE-DEXTROAMPHETAMINE 30 MG PO TABS: 30.0000 mg | ORAL_TABLET | Freq: Three times a day (TID) | ORAL | 0 refills | Status: DC

## 2019-03-18 ENCOUNTER — Other Ambulatory Visit: Payer: Self-pay | Admitting: Family Medicine

## 2019-03-18 MED ORDER — AMPHETAMINE-DEXTROAMPHETAMINE 30 MG PO TABS: 30.0000 mg | ORAL_TABLET | Freq: Three times a day (TID) | ORAL | 0 refills | Status: DC

## 2019-04-18 ENCOUNTER — Other Ambulatory Visit: Payer: Self-pay | Admitting: Family Medicine

## 2019-04-18 MED ORDER — AMPHETAMINE-DEXTROAMPHETAMINE 30 MG PO TABS: 30.0000 mg | ORAL_TABLET | Freq: Three times a day (TID) | ORAL | 0 refills | Status: DC

## 2019-05-18 ENCOUNTER — Other Ambulatory Visit: Payer: Self-pay | Admitting: Family Medicine

## 2019-05-18 MED ORDER — AMPHETAMINE-DEXTROAMPHETAMINE 30 MG PO TABS: 30.0000 mg | ORAL_TABLET | Freq: Three times a day (TID) | ORAL | 0 refills | Status: DC

## 2019-06-16 ENCOUNTER — Other Ambulatory Visit: Payer: Self-pay | Admitting: Family Medicine

## 2019-06-16 DIAGNOSIS — F902 Attention-deficit hyperactivity disorder, combined type: Secondary | ICD-10-CM

## 2019-06-16 MED ORDER — AMPHETAMINE-DEXTROAMPHETAMINE 30 MG PO TABS: 30.0000 mg | ORAL_TABLET | Freq: Three times a day (TID) | ORAL | 0 refills | Status: DC

## 2019-07-05 ENCOUNTER — Other Ambulatory Visit: Payer: Self-pay | Admitting: Family Medicine

## 2019-07-05 DIAGNOSIS — S39012A Strain of muscle, fascia and tendon of lower back, initial encounter: Secondary | ICD-10-CM

## 2019-07-05 MED ORDER — HYDROCODONE-ACETAMINOPHEN 5-325 MG PO TABS: 1.0000 | ORAL_TABLET | Freq: Four times a day (QID) | ORAL | 0 refills | Status: DC | PRN

## 2019-07-05 NOTE — Progress Notes (Signed)
He states that he apparently lifted something heavier than he should have and he is now having back pain again.  I will give him Norco which has worked well in the past for him.

## 2019-07-07 ENCOUNTER — Other Ambulatory Visit: Payer: Self-pay | Admitting: Family Medicine

## 2019-07-07 DIAGNOSIS — S39012A Strain of muscle, fascia and tendon of lower back, initial encounter: Secondary | ICD-10-CM

## 2019-07-07 MED ORDER — HYDROCODONE-ACETAMINOPHEN 5-325 MG PO TABS: 1.0000 | ORAL_TABLET | Freq: Four times a day (QID) | ORAL | 0 refills | Status: DC | PRN

## 2019-07-07 MED ORDER — CARISOPRODOL 350 MG PO TABS: 350.0000 mg | ORAL_TABLET | Freq: Three times a day (TID) | ORAL | 0 refills | Status: DC | PRN

## 2019-07-07 NOTE — Progress Notes (Signed)
He is still having back pain and getting ready for a heavy work load so I will cover him

## 2019-07-15 ENCOUNTER — Other Ambulatory Visit: Payer: Self-pay | Admitting: Family Medicine

## 2019-07-15 MED ORDER — AMPHETAMINE-DEXTROAMPHETAMINE 30 MG PO TABS: 30.0000 mg | ORAL_TABLET | Freq: Three times a day (TID) | ORAL | 0 refills | Status: DC

## 2019-08-10 ENCOUNTER — Other Ambulatory Visit: Payer: Self-pay | Admitting: Family Medicine

## 2019-08-12 ENCOUNTER — Other Ambulatory Visit: Payer: Self-pay | Admitting: Family Medicine

## 2019-08-12 MED ORDER — AMPHETAMINE-DEXTROAMPHETAMINE 30 MG PO TABS: 30.0000 mg | ORAL_TABLET | Freq: Three times a day (TID) | ORAL | 0 refills | Status: DC

## 2019-08-13 ENCOUNTER — Encounter: Payer: Self-pay | Admitting: Family Medicine

## 2019-08-13 ENCOUNTER — Other Ambulatory Visit: Payer: Self-pay

## 2019-08-13 ENCOUNTER — Ambulatory Visit: Payer: BC Managed Care – PPO | Admitting: Family Medicine

## 2019-08-13 VITALS — Temp 98.6°F | Wt 175.0 lb

## 2019-08-13 DIAGNOSIS — Z720 Tobacco use: Secondary | ICD-10-CM | POA: Diagnosis not present

## 2019-08-13 DIAGNOSIS — F902 Attention-deficit hyperactivity disorder, combined type: Secondary | ICD-10-CM | POA: Diagnosis not present

## 2019-08-13 NOTE — Progress Notes (Signed)
   Subjective:    Patient ID: Jacob Bright, male    DOB: 1991-02-10, 29 y.o.   MRN: [id number]  HPI  Documentation for virtual audio and video telecommunications through WebEx encounter: The patient was located at home. The provider was located in the office. The patient did consent to this visit and is aware of possible charges through their insurance for this visit. The other persons participating in this telemedicine service were none. Time spent on call was 16 minutes This virtual service is not related to other E/M service within previous 7 days. Today's visit is for ADHD.  He has a several year history with this and doing quite nicely on Adderall 30 mg 3 times daily.  1 pill lasts roughly 3 to 5 hours depending upon his work schedule.  When it wears off he does get slightly anxious and obviously loses focus.  He is happy with this.  His work schedule sometimes does make it difficult for him to take this on a regular basis as he sometimes can work 20+ hours. Recently he apparently did have a rib injury and was seen in urgent care center but apparently did not get an x-ray.  He states he is having difficulty with full function and is not able to work.   Review of Systems     Objective:   Physical Exam Alert and in no distress otherwise not examined       Assessment & Plan:  ADHD (attention deficit hyperactivity disorder), combined type  Chews tobacco I will continue him on his present medication regimen.  Discussed possibly getting an urgent care visit concerning his ribs to get a note. I also noted that he was chewing tobacco and strongly encouraged him to stop that.

## 2019-08-18 ENCOUNTER — Ambulatory Visit
Admission: RE | Admit: 2019-08-18 | Discharge: 2019-08-18 | Disposition: A | Discharge status: Home / Self Care | Payer: BC Managed Care – PPO | Source: Ambulatory Visit | Attending: Family Medicine | Admitting: Family Medicine

## 2019-08-18 ENCOUNTER — Ambulatory Visit: Payer: BC Managed Care – PPO | Admitting: Family Medicine

## 2019-08-18 ENCOUNTER — Other Ambulatory Visit: Payer: Self-pay

## 2019-08-18 VITALS — BP 110/74 | HR 99 | Temp 97.3°F | Wt 171.8 lb

## 2019-08-18 DIAGNOSIS — R0781 Pleurodynia: Secondary | ICD-10-CM | POA: Diagnosis not present

## 2019-08-18 DIAGNOSIS — R0789 Other chest pain: Secondary | ICD-10-CM

## 2019-08-18 DIAGNOSIS — R079 Chest pain, unspecified: Secondary | ICD-10-CM | POA: Diagnosis not present

## 2019-08-18 NOTE — Progress Notes (Signed)
   Subjective:    Patient ID: Jacob Bright, male    DOB: Jan 20, 1991, 29 y.o.   MRN: [id number]  HPI He is here for evaluation of rib pain.  He injured it 10 days ago when he fell on an object and injured his right anterior chest area.  He had some discomfort then but had much worse pain 2 days later when he had pleuritic type pain.  It is much better today but still causing some trouble with some radiation into his back.  No shortness of breath, cough, congestion, fever.   Review of Systems     Objective:   Physical Exam Alert and in no distress.  Slight tenderness palpation over the right costochondral junction.  Lungs are clear to auscultation.  No palpable tenderness in other areas.  Chest x-ray was independently reviewed by me and is normal.      Assessment & Plan:  Rib pain on right side - Plan: DG Chest 2 View  2 Tylenol 4 times per day and 4 ibuprofen 3 times per day.  Stay on this for the next week I explained that the pain should slowly go away.  A note was written for him to return to work.

## 2019-08-18 NOTE — Patient Instructions (Addendum)
2 Tylenol 4 times per day and 4 ibuprofen 3 times per day.  Stay on this for the next week

## 2019-09-05 ENCOUNTER — Ambulatory Visit: Payer: BC Managed Care – PPO | Attending: Internal Medicine

## 2019-09-05 ENCOUNTER — Ambulatory Visit: Payer: BC Managed Care – PPO

## 2019-09-05 DIAGNOSIS — Z23 Encounter for immunization: Secondary | ICD-10-CM

## 2019-09-05 NOTE — Progress Notes (Signed)
   Covid-19 Vaccination Clinic  Name:  JAYMARI CROMIE    MRN: [id number] DOB: Oct 09, 1990  09/05/2019  Mr. Cress was observed post Covid-19 immunization for 15 minutes without incident. He was provided with Vaccine Information Sheet and instruction to access the V-Safe system.   Mr. Petersen was instructed to call 911 with any severe reactions post vaccine: Marland Kitchen Difficulty breathing  . Swelling of face and throat  . A fast heartbeat  . A bad rash all over body  . Dizziness and weakness   Immunizations Administered    Name Date Dose VIS Date Route   Moderna COVID-19 Vaccine 09/05/2019 12:13 PM 0.5 mL 04/22/2019 Intramuscular   Manufacturer: Moderna   Lot: [id number]   NDC: 56387-564-33

## 2019-09-10 ENCOUNTER — Other Ambulatory Visit: Payer: Self-pay | Admitting: Family Medicine

## 2019-09-10 DIAGNOSIS — F902 Attention-deficit hyperactivity disorder, combined type: Secondary | ICD-10-CM

## 2019-09-10 MED ORDER — AMPHETAMINE-DEXTROAMPHETAMINE 30 MG PO TABS: 30.0000 mg | ORAL_TABLET | Freq: Three times a day (TID) | ORAL | 0 refills | Status: DC

## 2019-09-10 NOTE — Progress Notes (Signed)
The first store did not have the med

## 2019-10-10 ENCOUNTER — Other Ambulatory Visit: Payer: Self-pay | Admitting: Family Medicine

## 2019-10-10 ENCOUNTER — Ambulatory Visit: Payer: BC Managed Care – PPO

## 2019-10-10 DIAGNOSIS — F902 Attention-deficit hyperactivity disorder, combined type: Secondary | ICD-10-CM

## 2019-10-10 MED ORDER — AMPHETAMINE-DEXTROAMPHETAMINE 30 MG PO TABS: 30.0000 mg | ORAL_TABLET | Freq: Three times a day (TID) | ORAL | 0 refills | Status: DC

## 2019-10-10 NOTE — Progress Notes (Signed)
The first store was closed

## 2019-11-09 ENCOUNTER — Other Ambulatory Visit: Payer: Self-pay | Admitting: Family Medicine

## 2019-11-09 DIAGNOSIS — F902 Attention-deficit hyperactivity disorder, combined type: Secondary | ICD-10-CM

## 2019-11-09 MED ORDER — AMPHETAMINE-DEXTROAMPHETAMINE 30 MG PO TABS: 30.0000 mg | ORAL_TABLET | Freq: Three times a day (TID) | ORAL | 0 refills | Status: DC

## 2019-11-09 NOTE — Progress Notes (Signed)
Drugstore was switched to 1 in [place] as opposed to [place] at his request.  I called and canceled the prescription in [facility]

## 2019-12-09 ENCOUNTER — Other Ambulatory Visit: Payer: Self-pay | Admitting: Family Medicine

## 2019-12-09 DIAGNOSIS — F902 Attention-deficit hyperactivity disorder, combined type: Secondary | ICD-10-CM

## 2019-12-09 MED ORDER — AMPHETAMINE-DEXTROAMPHETAMINE 30 MG PO TABS: 30.0000 mg | ORAL_TABLET | Freq: Three times a day (TID) | ORAL | 0 refills | Status: DC

## 2020-01-09 ENCOUNTER — Telehealth: Payer: Self-pay | Admitting: Family Medicine

## 2020-01-09 DIAGNOSIS — F902 Attention-deficit hyperactivity disorder, combined type: Secondary | ICD-10-CM

## 2020-01-09 MED ORDER — AMPHETAMINE-DEXTROAMPHETAMINE 30 MG PO TABS: 30.0000 mg | ORAL_TABLET | Freq: Three times a day (TID) | ORAL | 0 refills | Status: DC

## 2020-01-09 NOTE — Telephone Encounter (Signed)
ALERT PT HAS NEW NUMBER pt call would like to speak to you. please call new number [phone].

## 2020-02-09 ENCOUNTER — Telehealth: Payer: Self-pay

## 2020-02-09 DIAGNOSIS — F902 Attention-deficit hyperactivity disorder, combined type: Secondary | ICD-10-CM

## 2020-02-09 MED ORDER — AMPHETAMINE-DEXTROAMPHETAMINE 30 MG PO TABS: 30.0000 mg | ORAL_TABLET | Freq: Three times a day (TID) | ORAL | 0 refills | Status: DC

## 2020-02-09 NOTE — Telephone Encounter (Signed)
Adderall was called in 1 day early

## 2020-02-09 NOTE — Telephone Encounter (Signed)
Pt. Called needs a refill on his Adderall but wants you to call him because he is not sure where he needs it sent into. I told him just to let me know what pharmacy to send it to and I could let you know but he kept insisting you call him.

## 2020-03-09 ENCOUNTER — Telehealth: Payer: Self-pay | Admitting: Family Medicine

## 2020-03-09 DIAGNOSIS — F902 Attention-deficit hyperactivity disorder, combined type: Secondary | ICD-10-CM

## 2020-03-09 MED ORDER — AMPHETAMINE-DEXTROAMPHETAMINE 30 MG PO TABS: 30.0000 mg | ORAL_TABLET | Freq: Three times a day (TID) | ORAL | 0 refills | Status: DC

## 2020-03-09 NOTE — Telephone Encounter (Signed)
Pt called and want you to give him a call regarding his refill on adderall, pt would not tell me his pharmacy states he had to talk to you regarding it states he had to tell u where to send it  Pt can be reached at [phone]

## 2020-04-09 ENCOUNTER — Telehealth: Payer: Self-pay | Admitting: Family Medicine

## 2020-04-09 DIAGNOSIS — F902 Attention-deficit hyperactivity disorder, combined type: Secondary | ICD-10-CM

## 2020-04-09 MED ORDER — AMPHETAMINE-DEXTROAMPHETAMINE 30 MG PO TABS: 30.0000 mg | ORAL_TABLET | Freq: Three times a day (TID) | ORAL | 0 refills | Status: DC

## 2020-04-09 NOTE — Telephone Encounter (Signed)
Pt called and is requesting a refill on adderall pt wants you to call him about where to send it, he would not tell me which pharmacy, said he only wants to talk to you about it pt can be reached at [phone]

## 2020-04-19 ENCOUNTER — Other Ambulatory Visit: Payer: Self-pay | Admitting: Family Medicine

## 2020-04-19 ENCOUNTER — Telehealth: Payer: Self-pay | Admitting: Family Medicine

## 2020-04-19 DIAGNOSIS — F902 Attention-deficit hyperactivity disorder, combined type: Secondary | ICD-10-CM

## 2020-04-19 MED ORDER — AMPHETAMINE-DEXTROAMPHETAMINE 30 MG PO TABS: 30.0000 mg | ORAL_TABLET | Freq: Three times a day (TID) | ORAL | 0 refills | Status: DC

## 2020-04-19 NOTE — Telephone Encounter (Signed)
Pt called and asked that JCL call him back concerning his medications. Pt can be reached at [phone].

## 2020-04-19 NOTE — Progress Notes (Signed)
Adderall called in.  The other pharmacy did not have enough.
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
## 2020-04-20 ENCOUNTER — Telehealth: Payer: Self-pay | Admitting: Family Medicine

## 2020-04-20 DIAGNOSIS — F902 Attention-deficit hyperactivity disorder, combined type: Secondary | ICD-10-CM

## 2020-04-20 MED ORDER — AMPHETAMINE-DEXTROAMPHETAMINE 30 MG PO TABS: 30.0000 mg | ORAL_TABLET | Freq: Three times a day (TID) | ORAL | 0 refills | Status: DC

## 2020-04-20 NOTE — Telephone Encounter (Signed)
Pharmacy is requesting to  have e script with the ok in comment section. Thanks [employer]

## 2020-04-20 NOTE — Telephone Encounter (Signed)
OK to refill early?

## 2020-04-20 NOTE — Telephone Encounter (Signed)
Pt called and states that he needs u to call the pharmacy to give them the ok to fill his adderall early he states they will not fill it with out your ok, [id number], [place] - [address]

## 2020-05-18 ENCOUNTER — Telehealth: Payer: Self-pay | Admitting: Family Medicine

## 2020-05-18 NOTE — Telephone Encounter (Signed)
No.  He always does this and wanting it renewed early.  Have him let me know where he is going to be on the 30th and I will renew it

## 2020-05-18 NOTE — Telephone Encounter (Signed)
Pt will be out of town late tomorrow and needs his Adderall refilled tomorrow before he leaves. [facility]  [address] [place]

## 2020-05-19 ENCOUNTER — Telehealth: Payer: Self-pay | Admitting: Family Medicine

## 2020-05-19 DIAGNOSIS — F902 Attention-deficit hyperactivity disorder, combined type: Secondary | ICD-10-CM

## 2020-05-19 MED ORDER — AMPHETAMINE-DEXTROAMPHETAMINE 30 MG PO TABS: 30.0000 mg | ORAL_TABLET | Freq: Three times a day (TID) | ORAL | 0 refills | Status: DC

## 2020-05-19 NOTE — Telephone Encounter (Signed)
Pt called back, family has covid so he is no longer going out of town Please send Adderall rx to   [facility]  [address] [place] so he can pick up tomorrow

## 2020-06-04 ENCOUNTER — Telehealth: Payer: Self-pay | Admitting: Family Medicine

## 2020-06-04 NOTE — Telephone Encounter (Signed)
We discussed earlier.  Paperwork was sent

## 2020-06-04 NOTE — Telephone Encounter (Signed)
Pt is currently in [place] getting a DOT CPE and he needs something faxed to them with our letter head stating that he takes Adderrall for ADHD. The letter can be faxed to [phone]. He can not leave until he has this info faxed

## 2020-06-18 ENCOUNTER — Telehealth: Payer: Self-pay

## 2020-06-18 DIAGNOSIS — F902 Attention-deficit hyperactivity disorder, combined type: Secondary | ICD-10-CM

## 2020-06-18 MED ORDER — AMPHETAMINE-DEXTROAMPHETAMINE 30 MG PO TABS: 30.0000 mg | ORAL_TABLET | Freq: Three times a day (TID) | ORAL | 0 refills | Status: DC

## 2020-06-18 NOTE — Telephone Encounter (Signed)
Pt. Called requesting a refill on his Adderall [facility] pharmacy [facility] [address] [place]. If you have any questions you can give him a call. Pt. Last apt was 08/18/19.

## 2020-06-30 ENCOUNTER — Telehealth: Payer: Self-pay | Admitting: Family Medicine

## 2020-06-30 NOTE — Telephone Encounter (Signed)
Pt called states that he needs a letter for his work showing that he takes adderall and the dosage and how his diagnosis  And that he needs also needs it stating that he had refill before Jan the 14th because he had a drug test on the 14th. Pt needs it to be sent to a Dr Princess Perna at [facility] at [phone]

## 2020-06-30 NOTE — Telephone Encounter (Signed)
Take care of this for him.  States that he is definitely taking this medication on a daily basis.

## 2020-07-01 NOTE — Telephone Encounter (Signed)
Letter faxed to [facility].  Tried calling pt and number says call can not be completed as dialed. Please verify phone # if pt calls back.  Original in brown folder for pt pick up

## 2020-07-20 ENCOUNTER — Telehealth: Payer: Self-pay

## 2020-07-20 DIAGNOSIS — F902 Attention-deficit hyperactivity disorder, combined type: Secondary | ICD-10-CM

## 2020-07-20 MED ORDER — AMPHETAMINE-DEXTROAMPHETAMINE 30 MG PO TABS: 30.0000 mg | ORAL_TABLET | Freq: Three times a day (TID) | ORAL | 0 refills | Status: DC

## 2020-07-20 NOTE — Telephone Encounter (Signed)
Pt needs refill [facility] to [facility] [place] t# [phone] [address] Jari Sportsman, he is there temporarily working

## 2020-08-19 ENCOUNTER — Telehealth: Payer: Self-pay

## 2020-08-19 ENCOUNTER — Other Ambulatory Visit: Payer: Self-pay | Admitting: Family Medicine

## 2020-08-19 DIAGNOSIS — F902 Attention-deficit hyperactivity disorder, combined type: Secondary | ICD-10-CM

## 2020-08-19 MED ORDER — AMPHETAMINE-DEXTROAMPHETAMINE 30 MG PO TABS: 30.0000 mg | ORAL_TABLET | Freq: Three times a day (TID) | ORAL | 0 refills | Status: DC

## 2020-08-19 NOTE — Telephone Encounter (Signed)
Set him up for virtual visit

## 2020-08-19 NOTE — Telephone Encounter (Signed)
PT. Scheduled for a virtual on 08/23/20.

## 2020-08-19 NOTE — Telephone Encounter (Signed)
Pt. Called requesting a refill on his Adderall sent to the [facility] in [place] [address] [facility] [address] pt. Stated NEEDS SENT IN BEFORE 1 P.M. TODAY HE IS GOING OUT OF TOWN PT. LAST APT. WAS 08/18/19.

## 2020-08-23 ENCOUNTER — Telehealth (INDEPENDENT_AMBULATORY_CARE_PROVIDER_SITE_OTHER): Payer: BC Managed Care – PPO | Admitting: Family Medicine

## 2020-08-23 ENCOUNTER — Encounter: Payer: Self-pay | Admitting: Family Medicine

## 2020-08-23 ENCOUNTER — Other Ambulatory Visit: Payer: Self-pay

## 2020-08-23 VITALS — Temp 98.7°F | Ht 74.0 in | Wt 170.0 lb

## 2020-08-23 DIAGNOSIS — F902 Attention-deficit hyperactivity disorder, combined type: Secondary | ICD-10-CM | POA: Diagnosis not present

## 2020-08-23 NOTE — Progress Notes (Signed)
   Subjective:    Patient ID: Jacob Bright, male    DOB: February 21, 1991, 30 y.o.   MRN: [id number]  HPI Documentation for virtual audio and video telecommunications through [place] encounter: The patient was located at home. 2 patient identifiers used.  The provider was located in the office. The patient did consent to this visit and is aware of possible charges through their insurance for this visit. The other persons participating in this telemedicine service were none. Time spent on call was 5 minutes and in review of previous records >20 minutes total for counseling and coordination of care. This virtual service is not related to other E/M service within previous 7 days. Today's visit is to discuss his ADHD.  He continues on Adderall 30 mg 3 times daily.  Each pill lasts roughly 3 or 4 hours.  He has no problem while on the medication other than it does help with focus.  No irritability or a withdrawal type symptoms.  He takes his last pill usually 3:00 in the afternoon.  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  He is living in [place] with his girlfriend and this seems to be going well for him.  Review of Systems     Objective:   Physical Exam Alert and in no distress otherwise not examined       Assessment & Plan:  ADHD (attention deficit hyperactivity disorder), combined type He is to continue on his present medication regimen and recommend he set up for complete exam sometime in the near future.

## 2020-09-16 ENCOUNTER — Telehealth: Payer: Self-pay | Admitting: Family Medicine

## 2020-09-16 DIAGNOSIS — F902 Attention-deficit hyperactivity disorder, combined type: Secondary | ICD-10-CM

## 2020-09-16 MED ORDER — AMPHETAMINE-DEXTROAMPHETAMINE 30 MG PO TABS: 30.0000 mg | ORAL_TABLET | Freq: Three times a day (TID) | ORAL | 0 refills | Status: DC

## 2020-09-16 NOTE — Telephone Encounter (Signed)
Pt called and is requesting a refill for [facility]ral [facility]  [address], [place], [place] [address] Informed pt that dr Susann Givens was out of the office

## 2020-10-14 ENCOUNTER — Telehealth: Payer: Self-pay | Admitting: Family Medicine

## 2020-10-14 DIAGNOSIS — F902 Attention-deficit hyperactivity disorder, combined type: Secondary | ICD-10-CM

## 2020-10-14 MED ORDER — AMPHETAMINE-DEXTROAMPHETAMINE 30 MG PO TABS: 30.0000 mg | ORAL_TABLET | Freq: Three times a day (TID) | ORAL | 0 refills | Status: DC

## 2020-10-14 NOTE — Telephone Encounter (Signed)
Pt called and states that he is leaving to go out of town on Sunday and will be gone the entire week. He is requesting a refill on Adderall to be pick up on Sunday 10/17/2020 so he can take with him. Pt can be reached at [phone].

## 2020-11-15 ENCOUNTER — Telehealth: Payer: Self-pay | Admitting: Family Medicine

## 2020-11-15 DIAGNOSIS — F902 Attention-deficit hyperactivity disorder, combined type: Secondary | ICD-10-CM

## 2020-11-15 MED ORDER — AMPHETAMINE-DEXTROAMPHETAMINE 30 MG PO TABS: 30.0000 mg | ORAL_TABLET | Freq: Three times a day (TID) | ORAL | 0 refills | Status: DC

## 2020-11-15 NOTE — Telephone Encounter (Signed)
Pt called and is requesting a refill on his adderall please [facility] the  [facility] [address], [place], [place] [address]

## 2020-12-14 ENCOUNTER — Other Ambulatory Visit: Payer: Self-pay | Admitting: Family Medicine

## 2020-12-14 DIAGNOSIS — F902 Attention-deficit hyperactivity disorder, combined type: Secondary | ICD-10-CM

## 2020-12-14 MED ORDER — AMPHETAMINE-DEXTROAMPHETAMINE 30 MG PO TABS: 30.0000 mg | ORAL_TABLET | Freq: Three times a day (TID) | ORAL | 0 refills | Status: DC

## 2020-12-15 ENCOUNTER — Other Ambulatory Visit: Payer: Self-pay | Admitting: Family Medicine

## 2020-12-15 DIAGNOSIS — F902 Attention-deficit hyperactivity disorder, combined type: Secondary | ICD-10-CM

## 2020-12-15 MED ORDER — AMPHETAMINE-DEXTROAMPHETAMINE 30 MG PO TABS: 30.0000 mg | ORAL_TABLET | Freq: Three times a day (TID) | ORAL | 0 refills | Status: DC

## 2020-12-15 NOTE — Progress Notes (Signed)
Called to a different store.Was not available at the other store

## 2021-01-13 ENCOUNTER — Telehealth: Payer: Self-pay

## 2021-01-13 DIAGNOSIS — F902 Attention-deficit hyperactivity disorder, combined type: Secondary | ICD-10-CM

## 2021-01-13 MED ORDER — AMPHETAMINE-DEXTROAMPHETAMINE 30 MG PO TABS: 30.0000 mg | ORAL_TABLET | Freq: Three times a day (TID) | ORAL | 0 refills | Status: DC

## 2021-01-13 NOTE — Telephone Encounter (Signed)
Pt. Called stating he needs a refill on his [facility]derall [facility] mg [facility] [place] and we should have that pharmacy on file for him. Pt. Last apt was 08/23/20.

## 2021-01-14 ENCOUNTER — Other Ambulatory Visit: Payer: Self-pay | Admitting: Family Medicine

## 2021-01-14 DIAGNOSIS — F902 Attention-deficit hyperactivity disorder, combined type: Secondary | ICD-10-CM

## 2021-01-14 MED ORDER — AMPHETAMINE-DEXTROAMPHETAMINE 30 MG PO TABS
30.0000 mg | ORAL_TABLET | Freq: Three times a day (TID) | ORAL | 0 refills | Status: DC
Start: 2021-01-15 — End: 2021-02-14

## 2021-01-14 NOTE — Progress Notes (Signed)
Needed to switch drug stores

## 2021-02-14 ENCOUNTER — Other Ambulatory Visit: Payer: Self-pay | Admitting: Family Medicine

## 2021-02-14 DIAGNOSIS — F902 Attention-deficit hyperactivity disorder, combined type: Secondary | ICD-10-CM

## 2021-02-14 MED ORDER — AMPHETAMINE-DEXTROAMPHETAMINE 30 MG PO TABS
30.0000 mg | ORAL_TABLET | Freq: Three times a day (TID) | ORAL | 0 refills | Status: DC
Start: 2021-02-15 — End: 2021-02-15

## 2021-02-15 ENCOUNTER — Other Ambulatory Visit: Payer: Self-pay | Admitting: Family Medicine

## 2021-02-15 DIAGNOSIS — F902 Attention-deficit hyperactivity disorder, combined type: Secondary | ICD-10-CM

## 2021-02-15 MED ORDER — AMPHETAMINE-DEXTROAMPHETAMINE 30 MG PO TABS: 30.0000 mg | ORAL_TABLET | Freq: Three times a day (TID) | ORAL | 0 refills | Status: DC

## 2021-02-15 NOTE — Progress Notes (Signed)
[facility] not [facility] and I had to call it into a different store

## 2021-03-16 ENCOUNTER — Other Ambulatory Visit: Payer: Self-pay | Admitting: Family Medicine

## 2021-03-16 DIAGNOSIS — F902 Attention-deficit hyperactivity disorder, combined type: Secondary | ICD-10-CM

## 2021-03-16 MED ORDER — AMPHETAMINE-DEXTROAMPHETAMINE 30 MG PO TABS
30.0000 mg | ORAL_TABLET | Freq: Three times a day (TID) | ORAL | 0 refills | Status: DC
Start: 2021-03-16 — End: 2021-03-16

## 2021-03-16 MED ORDER — AMPHETAMINE-DEXTROAMPHET ER 30 MG PO CP24: 30.0000 mg | ORAL_CAPSULE | Freq: Two times a day (BID) | ORAL | 0 refills | Status: DC

## 2021-03-16 NOTE — Progress Notes (Signed)
Nationwide shortage of Adderall causing problems

## 2021-04-13 ENCOUNTER — Other Ambulatory Visit: Payer: Self-pay | Admitting: Family Medicine

## 2021-04-13 MED ORDER — AMPHETAMINE-DEXTROAMPHET ER 30 MG PO CP24
30.0000 mg | ORAL_CAPSULE | Freq: Two times a day (BID) | ORAL | 0 refills | Status: DC
Start: 2021-04-16 — End: 2021-04-15

## 2021-04-15 ENCOUNTER — Other Ambulatory Visit: Payer: Self-pay | Admitting: Family Medicine

## 2021-04-15 MED ORDER — AMPHETAMINE-DEXTROAMPHET ER 30 MG PO CP24: 30.0000 mg | ORAL_CAPSULE | Freq: Two times a day (BID) | ORAL | 0 refills | Status: DC

## 2021-04-15 NOTE — Progress Notes (Signed)
Sent to the wrong store.[place] is correct

## 2021-05-03 ENCOUNTER — Telehealth: Payer: Self-pay | Admitting: Family Medicine

## 2021-05-03 NOTE — Telephone Encounter (Signed)
Faxed copy of medication list to Dr. Princess Perna [phone] per patient request

## 2021-05-13 ENCOUNTER — Other Ambulatory Visit: Payer: Self-pay | Admitting: Family Medicine

## 2021-05-13 MED ORDER — AMPHETAMINE-DEXTROAMPHET ER 30 MG PO CP24: 30.0000 mg | ORAL_CAPSULE | Freq: Two times a day (BID) | ORAL | 0 refills | Status: DC

## 2021-05-16 ENCOUNTER — Other Ambulatory Visit: Payer: Self-pay | Admitting: Family Medicine

## 2021-05-16 MED ORDER — AMPHETAMINE-DEXTROAMPHET ER 30 MG PO CP24: 30.0000 mg | ORAL_CAPSULE | Freq: Two times a day (BID) | ORAL | 0 refills | Status: DC

## 2021-05-25 ENCOUNTER — Telehealth: Payer: Self-pay | Admitting: Family Medicine

## 2021-05-25 ENCOUNTER — Encounter: Payer: Self-pay | Admitting: Family Medicine

## 2021-05-25 NOTE — Telephone Encounter (Signed)
This letter has been completed and faxed

## 2021-05-25 NOTE — Telephone Encounter (Signed)
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 The letter needs to state that he is prescribed this medication for ADHD and it is safe for him to drive while he takes this medication. He needs it faxed to [phone]. Their is a note like this typed by Vernona Rieger last year in his chart

## 2021-06-16 ENCOUNTER — Other Ambulatory Visit: Payer: Self-pay | Admitting: Family Medicine

## 2021-06-16 MED ORDER — AMPHETAMINE-DEXTROAMPHETAMINE 30 MG PO TABS: 30.0000 mg | ORAL_TABLET | Freq: Three times a day (TID) | ORAL | 0 refills | Status: DC

## 2021-06-16 NOTE — Progress Notes (Signed)
Patient is caught in a Environmental health practitioner.  He will probably be switched back and forth between the instant release in the extended release.

## 2021-07-19 ENCOUNTER — Other Ambulatory Visit: Payer: Self-pay | Admitting: Medical

## 2021-07-19 MED ORDER — AMPHETAMINE-DEXTROAMPHET ER 30 MG PO CP24: 30.0000 mg | ORAL_CAPSULE | Freq: Every day | ORAL | 0 refills | Status: DC

## 2021-08-15 ENCOUNTER — Other Ambulatory Visit: Payer: Self-pay | Admitting: Family Medicine

## 2021-08-15 MED ORDER — AMPHETAMINE-DEXTROAMPHETAMINE 30 MG PO TABS: 30.0000 mg | ORAL_TABLET | Freq: Three times a day (TID) | ORAL | 0 refills | Status: DC

## 2021-08-17 ENCOUNTER — Ambulatory Visit
Admission: EM | Admit: 2021-08-17 | Discharge: 2021-08-17 | Disposition: A | Discharge status: Home / Self Care | Payer: BC Managed Care – PPO

## 2021-08-17 ENCOUNTER — Other Ambulatory Visit: Payer: Self-pay

## 2021-08-17 DIAGNOSIS — J069 Acute upper respiratory infection, unspecified: Secondary | ICD-10-CM

## 2021-08-17 DIAGNOSIS — S39012A Strain of muscle, fascia and tendon of lower back, initial encounter: Secondary | ICD-10-CM

## 2021-08-17 NOTE — ED Triage Notes (Signed)
Patient is here for "Congestion". Woke up Saturday morning with "Fever" (unknown degree), Felt bad through this am "on/loff Fever". Some congestion in sinus area. Body aches, Some nausea on Sat., No vomiting. Had to call out of work for the week, Needs Note. @ home COVID19 test "Negative".  ? ?Also, requests area on back to be checked "aggravated area of skin, lower mid back".  ?

## 2021-08-17 NOTE — ED Provider Notes (Signed)
?[facility] ? ? ? ?CSN: [id number] ?Arrival date & time: 08/17/21  9509 ? ? ?  ? ?History   ?Chief Complaint ?Chief Complaint  ?Patient presents with  ? Nasal Congestion  ? ? ?HPI ?Jacob Bright is a 31 y.o. male.  ? ?Pt complains of nasal congestion and cough.  Pt reports he has had bodyaches since MOnday ? ?The history is provided by the patient. No language interpreter was used.  ?Cough ?Cough characteristics:  Non-productive ?Onset quality:  Gradual ?Duration:  4 days ?Timing:  Constant ?Progression:  Improving ? ?Past Medical History:  ?Diagnosis Date  ? Chronic tonsillitis 09/2014  ? Exercise-induced asthma   ? no current med.  ? Spine misalignment   ? due to history of fx. hip as a teenager  ? ? ?Patient Active Problem List  ? Diagnosis Date Noted  ? Chews tobacco 08/13/2019  ? Chronic tonsillitis 10/26/2014  ? Acute mechanical low back pain with duration of less than six weeks 08/13/2014  ? ADHD (attention deficit hyperactivity disorder), combined type 06/12/2013  ? ? ?Past Surgical History:  ?Procedure Laterality Date  ? ANKLE ARTHROSCOPY WITH RECONSTRUCTION Right 03/14/2013  ? Procedure: ANKLE ARTHROSCOPY WITH RECONSTRUCTION;  Surgeon: Nadara Mustard, MD;  Location: [facility];  Service: Orthopedics;  Laterality: Right;  Right Ankle Gould Modified Brostrom Reconstruction with Ankle Arthroscopy  ? NASAL SEPTOPLASTY W/ TURBINOPLASTY  05/10/2010  ? bilateral inferior turbinate reduction  ? TONSILLECTOMY N/A 10/26/2014  ? Procedure: TONSILLECTOMY;  Surgeon: Christia Reading, MD;  Location: [facility];  Service: ENT;  Laterality: N/A;  ? TYMPANOSTOMY TUBE PLACEMENT    ? ? ? ? ? ?Home Medications   ? ?Prior to Admission medications   ?Medication Sig Start Date End Date Taking? Authorizing Provider  ?amphetamine-dextroamphetamine (ADDERALL) 30 MG tablet Take 1 tablet by mouth 3 (three) times daily. 08/16/21  Yes Ronnald Nian, MD  ?albuterol (PROVENTIL HFA;VENTOLIN HFA) 108 (90 Base) MCG/ACT inhaler  Inhale 2 puffs into the lungs every 6 (six) hours as needed for wheezing or shortness of breath. ?Patient not taking: No sig reported 05/28/17   Ofilia Neas, PA-C  ?amphetamine-dextroamphetamine (ADDERALL XR) 30 MG 24 hr capsule Take 1 capsule (30 mg total) by mouth daily. 07/19/21   Tysinger, Kermit Balo, PA-C  ?busPIRone (BUSPAR) 10 MG tablet 1 tablet 2 (two) times daily. ?Patient not taking: Reported on 08/23/2020 01/16/17   [provider]  ?carisoprodol (SOMA) 350 MG tablet Take 1 tablet (350 mg total) by mouth 3 (three) times daily with meals as needed for muscle spasms. ?Patient not taking: No sig reported 07/07/19   Ronnald Nian, MD  ?HYDROcodone-acetaminophen (NORCO) 5-325 MG tablet Take 1 tablet by mouth every 6 (six) hours as needed for moderate pain. ?Patient not taking: No sig reported 07/07/19   Ronnald Nian, MD  ?QUEtiapine (SEROQUEL) 25 MG tablet 1 tablet at bedtime as needed. ?Patient not taking: Reported on 08/23/2020 03/12/17   [provider]  ?rOPINIRole (REQUIP) 0.5 MG tablet 1 tablet at bedtime as needed. ?Patient not taking: Reported on 08/23/2020 03/12/17   [provider]  ? ? ?Family History ?Family History  ?Problem Relation Age of Onset  ? Hypertension Father   ? ? ?Social History ?Social History  ? ?Tobacco Use  ? Smoking status: Never  ? Smokeless tobacco: Current  ?  Types: Chew  ? Tobacco comments:  ?  he has cut back  ?Vaping Use  ? Vaping  Use: Never used  ?Substance Use Topics  ? Alcohol use: Yes  ?  Comment: Occ.  ? Drug use: No  ? ? ? ?Allergies   ?Patient has no known allergies. ? ? ?Review of Systems ?Review of Systems  ?Respiratory:  Positive for cough.   ?All other systems reviewed and are negative. ? ? ?Physical Exam ?Triage Vital Signs ?ED Triage Vitals  ?Enc Vitals Group  ?   BP 08/17/21 0850 (!) 145/80  ?   Pulse Rate 08/17/21 0850 (!) 110  ?   Resp 08/17/21 0850 18  ?   Temp 08/17/21 0850 98.5 ?F (36.9 ?C)  ?   Temp Source 08/17/21 0850 Oral  ?    SpO2 08/17/21 0850 99 %  ?   Weight 08/17/21 0848 180 lb (81.6 kg)  ?   Height 08/17/21 0848 6\' 2"  (1.88 m)  ?   Head Circumference --   ?   Peak Flow --   ?   Pain Score 08/17/21 0847 0  ?   Pain Loc --   ?   Pain Edu? --   ?   Excl. in GC? --   ? ?No data found. ? ?Updated Vital Signs ?BP (!) 145/80 (BP Location: Left Arm)   Pulse (!) 118   Temp 98.5 ?F (36.9 ?C) (Oral)   Resp 18   Ht 6\' 2"  (1.88 m)   Wt 81.6 kg   SpO2 99%   BMI 23.11 kg/m?  ? ?Visual Acuity ?Right Eye Distance:   ?Left Eye Distance:   ?Bilateral Distance:   ? ?Right Eye Near:   ?Left Eye Near:    ?Bilateral Near:    ? ?Physical Exam ?Vitals and nursing note reviewed.  ?Constitutional:   ?   Appearance: He is well-developed.  ?HENT:  ?   Head: Normocephalic.  ?   Right Ear: Tympanic membrane normal.  ?   Left Ear: Tympanic membrane normal.  ?   Nose: Nose normal.  ?   Mouth/Throat:  ?   Mouth: Mucous membranes are moist.  ?Eyes:  ?   Pupils: Pupils are equal, round, and reactive to light.  ?Cardiovascular:  ?   Rate and Rhythm: Normal rate.  ?Pulmonary:  ?   Effort: Pulmonary effort is normal.  ?Abdominal:  ?   General: There is no distension.  ?Musculoskeletal:     ?   General: Tenderness present. Normal range of motion.  ?   Cervical back: Normal range of motion.  ?   Comments: Tender left paravertebrals  ?Neurological:  ?   General: No focal deficit present.  ?   Mental Status: He is alert and oriented to person, place, and time.  ?Psychiatric:     ?   Mood and Affect: Mood normal.  ? ? ? ?UC Treatments / Results  ?Labs ?(all labs ordered are listed, but only abnormal results are displayed) ?Labs Reviewed - No data to display ? ?EKG ? ? ?Radiology ?No results found. ? ?Procedures ?Procedures (including critical care time) ? ?Medications Ordered in UC ?Medications - No data to display ? ?Initial Impression / Assessment and Plan / UC Course  ?I have reviewed the triage vital signs and the nursing notes. ? ?Pertinent labs & imaging results  that were available during my care of the patient were reviewed by me and considered in my medical decision making (see chart for details). ? ?  ? ?Pt advised ibuprofen for muscle strain  ?Final Clinical Impressions(s) /  UC Diagnoses  ? ?Final diagnoses:  ?Viral URI with cough  ?Strain of muscle, fascia and tendon of lower back, initial encounter  ? ?Discharge Instructions   ?None ?  ? ?ED Prescriptions   ?None ?  ? ?PDMP not reviewed this encounter.An After Visit Summary was printed and given to the patient. ? ?  ?Elson Areas, [place] ?08/17/21 9767 ? ?

## 2021-09-15 ENCOUNTER — Other Ambulatory Visit: Payer: Self-pay | Admitting: Family Medicine

## 2021-09-16 ENCOUNTER — Encounter: Payer: Self-pay | Admitting: Family Medicine

## 2021-09-16 ENCOUNTER — Ambulatory Visit: Payer: BC Managed Care – PPO | Admitting: Family Medicine

## 2021-09-16 VITALS — BP 108/70 | HR 76 | Temp 97.7°F | Ht 75.0 in | Wt 187.8 lb

## 2021-09-16 DIAGNOSIS — Z23 Encounter for immunization: Secondary | ICD-10-CM | POA: Diagnosis not present

## 2021-09-16 DIAGNOSIS — Z79899 Other long term (current) drug therapy: Secondary | ICD-10-CM

## 2021-09-16 DIAGNOSIS — Z1159 Encounter for screening for other viral diseases: Secondary | ICD-10-CM | POA: Diagnosis not present

## 2021-09-16 DIAGNOSIS — F902 Attention-deficit hyperactivity disorder, combined type: Secondary | ICD-10-CM

## 2021-09-16 DIAGNOSIS — J301 Allergic rhinitis due to pollen: Secondary | ICD-10-CM

## 2021-09-16 DIAGNOSIS — Z8042 Family history of malignant neoplasm of prostate: Secondary | ICD-10-CM

## 2021-09-16 DIAGNOSIS — Z13220 Encounter for screening for lipoid disorders: Secondary | ICD-10-CM

## 2021-09-16 MED ORDER — AMPHETAMINE-DEXTROAMPHETAMINE 30 MG PO TABS: 30.0000 mg | ORAL_TABLET | Freq: Three times a day (TID) | ORAL | 0 refills | Status: DC

## 2021-09-16 NOTE — Progress Notes (Signed)
? ?  Subjective:  ? ? Patient ID: Jacob Bright, male    DOB: Mar 03, 1991, 31 y.o.   MRN: [id number] ? ?HPI ?He is here for an interval evaluation.  He does have underlying ADD and seems to be doing quite nicely on his Adderall.  He uses 30 mg regular strength 3 times daily and extended release twice daily depending upon availability of this medication.  The regular strength med lasts around 3 hours and extended release lasts roughly 5 hours.  There has been a Scientist, clinical (histocompatibility and immunogenetics) on this.  He does have underlying allergies and uses OTC meds with good results.  His work is going well.  He is trying to continue to improve himself to the point that he can go back to college.  He is engaged to be married.  Family history is not significant for father having prostate cancer. ?Immunization record was reviewed ? ?Review of Systems ? ?   ?Objective:  ? Physical Exam ?Alert and in no distress.  Cardiac exam shows regular rhythm without murmurs or gallops.  Lungs are clear to auscultation. ? ? ? ?   ?Assessment & Plan:  ?ADHD (attention deficit hyperactivity disorder), combined type - Plan: amphetamine-dextroamphetamine (ADDERALL) 30 MG tablet ? ?Need for Tdap vaccination - Plan: Tdap vaccine greater than or equal to 7yo IM ? ?Need for hepatitis C screening test - Plan: Hepatitis C antibody ? ?Encounter for long-term (current) use of medications - Plan: CBC with Differential/Platelet, Comprehensive metabolic panel, Lipid panel ? ?Screening for lipid disorders - Plan: Lipid panel ? ?Family history of prostate cancer in father ? ?Seasonal allergic rhinitis due to pollen - Plan: CBC with Differential/Platelet, Comprehensive metabolic panel ?He will continue on his present medication regimen.  Encouraged him to come back in about 6 months for complete examination.  Congratulated him on his engagement. ? ?

## 2021-09-17 LAB — CBC WITH DIFFERENTIAL/PLATELET
Basophils Absolute: 0.1 10*3/uL (ref 0.0–0.2)
Basos: 1 %
EOS (ABSOLUTE): 0.5 10*3/uL — ABNORMAL HIGH (ref 0.0–0.4)
Eos: 7 %
Hematocrit: 44.9 % (ref 37.5–51.0)
Hemoglobin: 14.7 g/dL (ref 13.0–17.7)
Immature Grans (Abs): 0 10*3/uL (ref 0.0–0.1)
Immature Granulocytes: 0 %
Lymphocytes Absolute: 1.9 10*3/uL (ref 0.7–3.1)
Lymphs: 30 %
MCH: 27.6 pg (ref 26.6–33.0)
MCHC: 32.7 g/dL (ref 31.5–35.7)
MCV: 84 fL (ref 79–97)
Monocytes Absolute: 0.7 10*3/uL (ref 0.1–0.9)
Monocytes: 11 %
Neutrophils Absolute: 3.3 10*3/uL (ref 1.4–7.0)
Neutrophils: 51 %
Platelets: 351 10*3/uL (ref 150–450)
RBC: 5.33 x10E6/uL (ref 4.14–5.80)
RDW: 15.5 % — ABNORMAL HIGH (ref 11.6–15.4)
WBC: 6.6 10*3/uL (ref 3.4–10.8)

## 2021-09-17 LAB — LIPID PANEL
Chol/HDL Ratio: 3.7 ratio (ref 0.0–5.0)
Cholesterol, Total: 192 mg/dL (ref 100–199)
HDL: 52 mg/dL (ref >39)
LDL Chol Calc (NIH): 110 mg/dL — ABNORMAL HIGH (ref 0–99)
Triglycerides: 172 mg/dL — ABNORMAL HIGH (ref 0–149)
VLDL Cholesterol Cal: 30 mg/dL (ref 5–40)

## 2021-09-17 LAB — COMPREHENSIVE METABOLIC PANEL
ALT: 18 IU/L (ref 0–44)
AST: 20 IU/L (ref 0–40)
Albumin/Globulin Ratio: 2 (ref 1.2–2.2)
Albumin: 4.6 g/dL (ref 4.1–5.2)
Alkaline Phosphatase: 65 IU/L (ref 44–121)
BUN/Creatinine Ratio: 9 (ref 9–20)
BUN: 10 mg/dL (ref 6–20)
Bilirubin Total: 0.3 mg/dL (ref 0.0–1.2)
CO2: 26 mmol/L (ref 20–29)
Calcium: 9.3 mg/dL (ref 8.7–10.2)
Chloride: 97 mmol/L (ref 96–106)
Creatinine, Ser: 1.1 mg/dL (ref 0.76–1.27)
Globulin, Total: 2.3 g/dL (ref 1.5–4.5)
Glucose: 87 mg/dL (ref 70–99)
Potassium: 4.9 mmol/L (ref 3.5–5.2)
Sodium: 139 mmol/L (ref 134–144)
Total Protein: 6.9 g/dL (ref 6.0–8.5)
eGFR: 94 mL/min/{1.73_m2} (ref >59)

## 2021-09-17 LAB — HEPATITIS C ANTIBODY: Hep C Virus Ab: NONREACTIVE

## 2021-10-14 ENCOUNTER — Other Ambulatory Visit: Payer: Self-pay | Admitting: Family Medicine

## 2021-10-14 DIAGNOSIS — F902 Attention-deficit hyperactivity disorder, combined type: Secondary | ICD-10-CM

## 2021-10-14 MED ORDER — AMPHETAMINE-DEXTROAMPHETAMINE 30 MG PO TABS: 30.0000 mg | ORAL_TABLET | Freq: Three times a day (TID) | ORAL | 0 refills | Status: DC

## 2021-11-01 IMAGING — CR DG CHEST 2V
2 series · 2 of 2 positions shown · non-contrast
Comparison: None.

CLINICAL DATA: Worsening right chest pain since a minor injury 2
weeks ago. Initial encounter.

EXAM:
CHEST - 2 VIEW

[w chest pa]
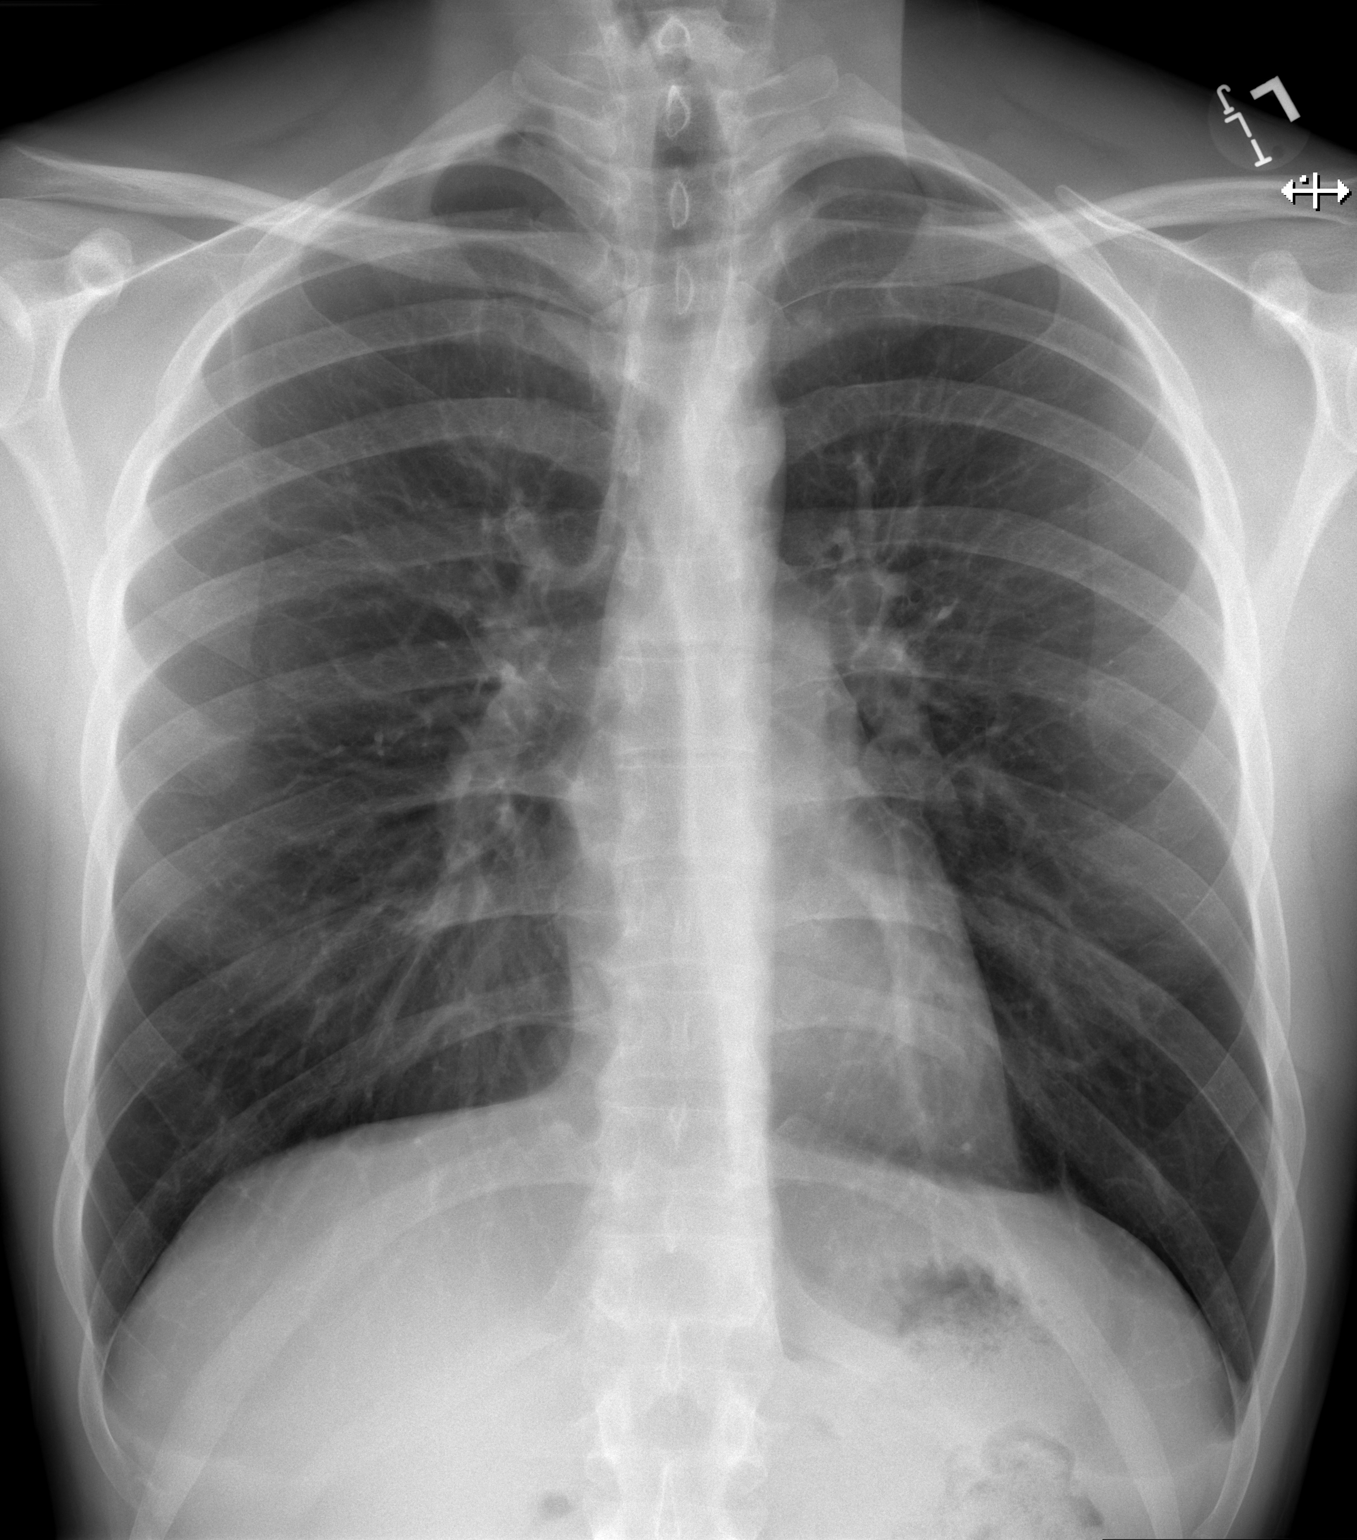

[w chest lat]
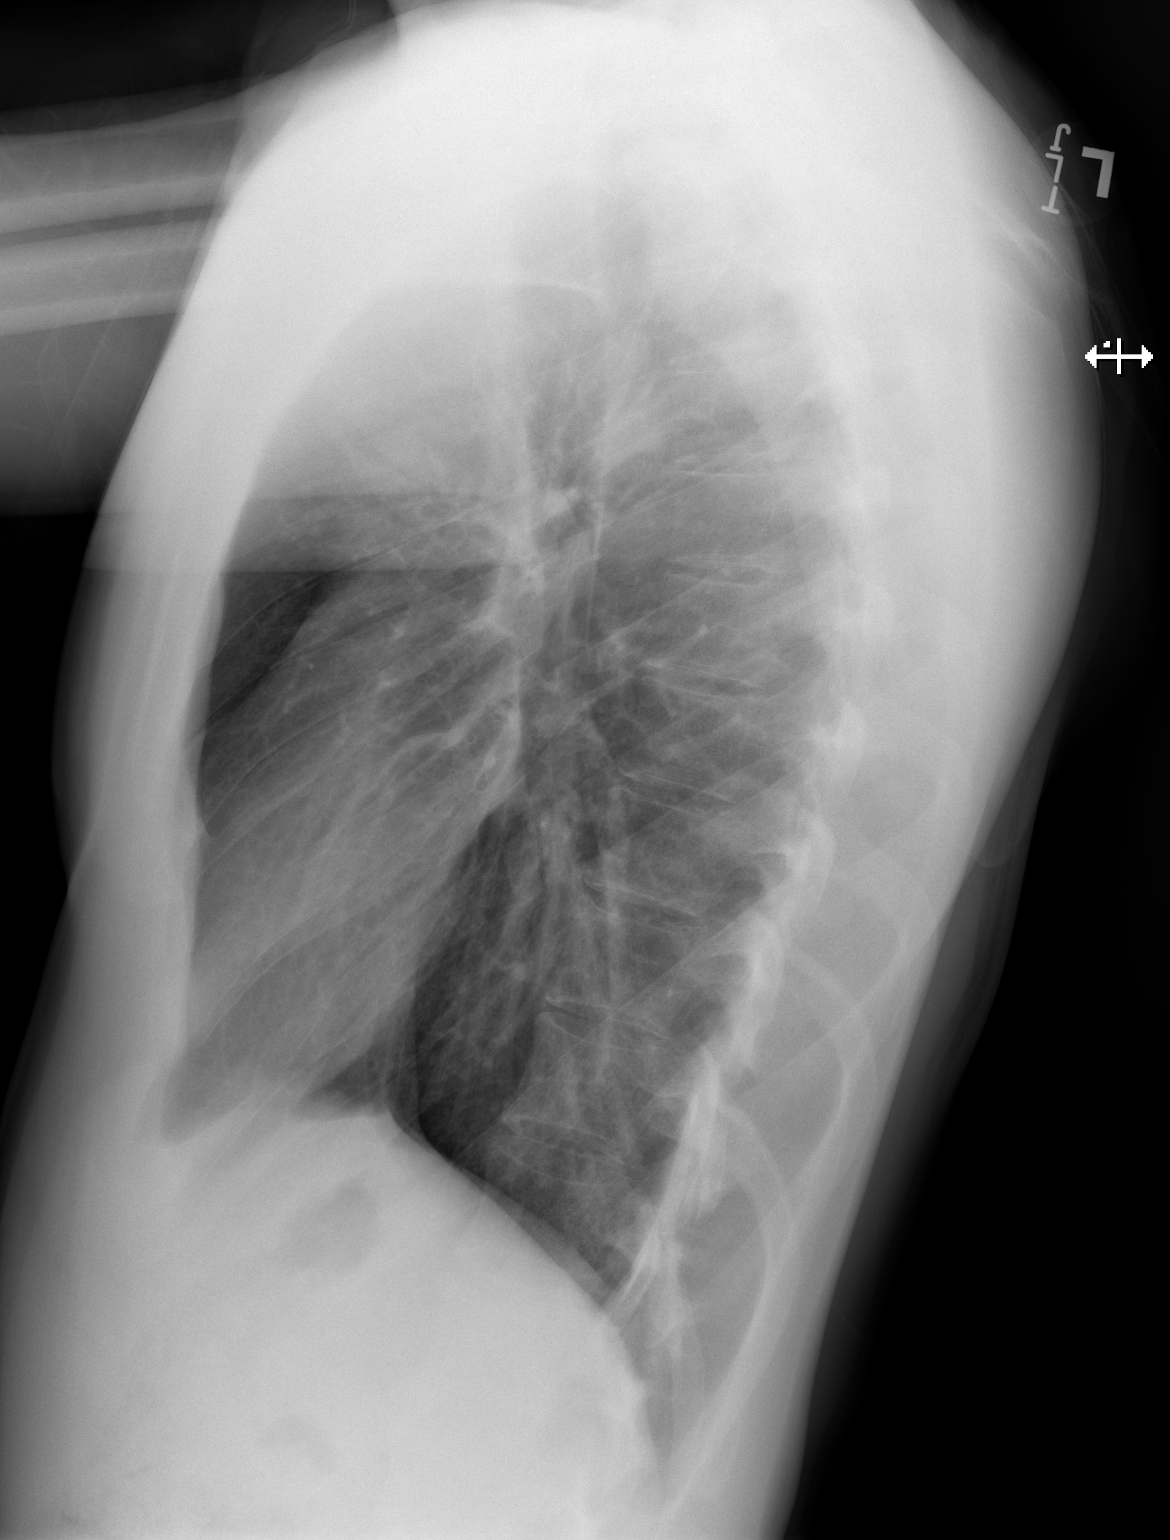

[2 of 2 positions shown; findings below may reference images not displayed]

FINDINGS: Lungs are clear. Heart size is normal. No pneumothorax or pleural
fluid. No bony abnormality.
IMPRESSION: Normal chest.

## 2021-11-13 ENCOUNTER — Other Ambulatory Visit: Payer: Self-pay | Admitting: Family Medicine

## 2021-11-13 DIAGNOSIS — F902 Attention-deficit hyperactivity disorder, combined type: Secondary | ICD-10-CM

## 2021-11-13 MED ORDER — AMPHETAMINE-DEXTROAMPHETAMINE 30 MG PO TABS: 30.0000 mg | ORAL_TABLET | Freq: Three times a day (TID) | ORAL | 0 refills | Status: DC

## 2021-12-19 ENCOUNTER — Telehealth: Payer: Self-pay | Admitting: Family Medicine

## 2021-12-19 ENCOUNTER — Other Ambulatory Visit: Payer: Self-pay | Admitting: Medical

## 2021-12-19 DIAGNOSIS — F902 Attention-deficit hyperactivity disorder, combined type: Secondary | ICD-10-CM

## 2021-12-19 MED ORDER — AMPHETAMINE-DEXTROAMPHETAMINE 30 MG PO TABS: 30.0000 mg | ORAL_TABLET | Freq: Three times a day (TID) | ORAL | 0 refills | Status: DC

## 2021-12-19 NOTE — Telephone Encounter (Signed)
Pt called for refills of adderall. Please send to [facility] in [place]. Pt can be reached at [phone]. Sending to [employer] as [name] is out.

## 2022-01-11 ENCOUNTER — Other Ambulatory Visit: Payer: Self-pay | Admitting: Family Medicine

## 2022-01-11 DIAGNOSIS — F902 Attention-deficit hyperactivity disorder, combined type: Secondary | ICD-10-CM

## 2022-01-11 MED ORDER — AMPHETAMINE-DEXTROAMPHETAMINE 30 MG PO TABS: 30.0000 mg | ORAL_TABLET | Freq: Three times a day (TID) | ORAL | 0 refills | Status: DC

## 2022-01-25 ENCOUNTER — Encounter: Payer: Self-pay | Admitting: Internal Medicine

## 2022-02-16 ENCOUNTER — Other Ambulatory Visit: Payer: Self-pay | Admitting: Family Medicine

## 2022-02-16 DIAGNOSIS — F902 Attention-deficit hyperactivity disorder, combined type: Secondary | ICD-10-CM

## 2022-02-16 MED ORDER — AMPHETAMINE-DEXTROAMPHETAMINE 30 MG PO TABS: 30.0000 mg | ORAL_TABLET | Freq: Three times a day (TID) | ORAL | 0 refills | Status: DC

## 2022-02-28 ENCOUNTER — Encounter: Payer: Self-pay | Admitting: Internal Medicine

## 2022-03-13 ENCOUNTER — Encounter: Payer: Self-pay | Admitting: Internal Medicine

## 2022-03-20 ENCOUNTER — Other Ambulatory Visit: Payer: Self-pay | Admitting: Family Medicine

## 2022-03-20 DIAGNOSIS — F902 Attention-deficit hyperactivity disorder, combined type: Secondary | ICD-10-CM

## 2022-03-20 MED ORDER — AMPHETAMINE-DEXTROAMPHETAMINE 30 MG PO TABS: 30.0000 mg | ORAL_TABLET | Freq: Three times a day (TID) | ORAL | 0 refills | Status: DC

## 2022-04-09 ENCOUNTER — Other Ambulatory Visit: Payer: Self-pay | Admitting: Family Medicine

## 2022-04-09 DIAGNOSIS — F902 Attention-deficit hyperactivity disorder, combined type: Secondary | ICD-10-CM

## 2022-04-09 MED ORDER — AMPHETAMINE-DEXTROAMPHETAMINE 30 MG PO TABS: 30.0000 mg | ORAL_TABLET | Freq: Three times a day (TID) | ORAL | 0 refills | Status: DC

## 2022-04-09 NOTE — Progress Notes (Signed)
He apparently lost the Rx on a [place] trip to the beach

## 2022-05-09 ENCOUNTER — Other Ambulatory Visit: Payer: Self-pay | Admitting: Family Medicine

## 2022-05-09 DIAGNOSIS — F902 Attention-deficit hyperactivity disorder, combined type: Secondary | ICD-10-CM

## 2022-05-09 MED ORDER — AMPHETAMINE-DEXTROAMPHETAMINE 30 MG PO TABS: 30.0000 mg | ORAL_TABLET | Freq: Three times a day (TID) | ORAL | 0 refills | Status: DC

## 2022-06-08 ENCOUNTER — Other Ambulatory Visit: Payer: Self-pay | Admitting: Family Medicine

## 2022-06-08 DIAGNOSIS — F902 Attention-deficit hyperactivity disorder, combined type: Secondary | ICD-10-CM

## 2022-06-08 MED ORDER — AMPHETAMINE-DEXTROAMPHETAMINE 30 MG PO TABS: 30.0000 mg | ORAL_TABLET | Freq: Three times a day (TID) | ORAL | 0 refills | Status: DC

## 2022-06-08 NOTE — Progress Notes (Signed)
His medication was refilled.  He was last seen in April.  Sent him a message to set up for another appointment

## 2022-06-09 ENCOUNTER — Other Ambulatory Visit: Payer: Self-pay | Admitting: Family Medicine

## 2022-06-09 MED ORDER — AMPHETAMINE-DEXTROAMPHETAMINE 20 MG PO TABS: ORAL_TABLET | ORAL | 0 refills | Status: DC

## 2022-06-09 NOTE — Progress Notes (Signed)
The  30 mg tablets are not available so called in 1.5 tab tid of 20 mg

## 2022-07-09 ENCOUNTER — Other Ambulatory Visit: Payer: Self-pay | Admitting: Family Medicine

## 2022-07-09 MED ORDER — AMPHETAMINE-DEXTROAMPHETAMINE 20 MG PO TABS: ORAL_TABLET | ORAL | 0 refills | Status: DC

## 2022-08-10 ENCOUNTER — Other Ambulatory Visit: Payer: Self-pay | Admitting: Family Medicine

## 2022-08-10 MED ORDER — AMPHETAMINE-DEXTROAMPHETAMINE 20 MG PO TABS: ORAL_TABLET | ORAL | 0 refills | Status: DC

## 2022-08-10 MED ORDER — AMPHETAMINE-DEXTROAMPHETAMINE 30 MG PO TABS: 30.0000 mg | ORAL_TABLET | Freq: Three times a day (TID) | ORAL | 0 refills | Status: DC

## 2022-09-07 ENCOUNTER — Other Ambulatory Visit: Payer: Self-pay | Admitting: Family Medicine

## 2022-09-10 ENCOUNTER — Other Ambulatory Visit: Payer: Self-pay | Admitting: Family Medicine

## 2022-09-10 MED ORDER — AMPHETAMINE-DEXTROAMPHETAMINE 30 MG PO TABS: 30.0000 mg | ORAL_TABLET | Freq: Three times a day (TID) | ORAL | 0 refills | Status: DC

## 2022-10-10 ENCOUNTER — Other Ambulatory Visit: Payer: Self-pay | Admitting: Medical

## 2022-10-10 ENCOUNTER — Telehealth: Payer: Self-pay | Admitting: Family Medicine

## 2022-10-10 MED ORDER — AMPHETAMINE-DEXTROAMPHETAMINE 30 MG PO TABS: 30.0000 mg | ORAL_TABLET | Freq: Three times a day (TID) | ORAL | 0 refills | Status: DC

## 2022-10-10 NOTE — Telephone Encounter (Signed)
Pt called back and states that [facility]  [address] [place], [place] [address] Has the adderall has the medicine in stock if you would send it in the for him

## 2022-10-10 NOTE — Telephone Encounter (Signed)
Pt called in requesting a paper prescription for his adderall, he states the pharmacies wont tell hi if they have the adderall he needs in stock so that is why he is asking for  it in paper.

## 2022-10-10 NOTE — Telephone Encounter (Signed)
Sending to [facility] for approval.

## 2022-11-09 ENCOUNTER — Other Ambulatory Visit: Payer: Self-pay | Admitting: Family Medicine

## 2022-11-10 ENCOUNTER — Telehealth (INDEPENDENT_AMBULATORY_CARE_PROVIDER_SITE_OTHER): Payer: Self-pay | Admitting: Family Medicine

## 2022-11-10 VITALS — Wt 185.0 lb

## 2022-11-10 DIAGNOSIS — F902 Attention-deficit hyperactivity disorder, combined type: Secondary | ICD-10-CM

## 2022-11-10 MED ORDER — AMPHETAMINE-DEXTROAMPHETAMINE 30 MG PO TABS: 30.0000 mg | ORAL_TABLET | Freq: Three times a day (TID) | ORAL | 0 refills | Status: DC

## 2022-11-10 NOTE — Progress Notes (Signed)
   Subjective:    Patient ID: MARJORIE LUSSIER, male    DOB: 03/06/1991, 32 y.o.   MRN: [id number]  HPI Documentation for virtual audio and video telecommunications through [place] encounter:  The patient was located at home. 2 patient identifiers used.  The provider was located in the office. The patient did consent to this visit and is aware of possible charges through their insurance for this visit. The other persons participating in this telemedicine service were none. Time spent on call was 5 minutes and in review of previous records >15 minutes total for counseling and coordination of care. This virtual service is not related to other E/M service within previous 7 days.  He has a history of ADD and was diagnosed in 2021.  Since then he was eventually stabilized on Adderall 30 mg 3 times per day.  Each pill lasts roughly 3 hours.  When it wears off he usually has decreased focus.  This does not interfere with his eating.  He is not on any other medications.  He is now married and looking for a job with [employer].  Review of Systems     Objective:    Physical Exam Alert and in no distress otherwise not examined       Assessment & Plan:   ADHD (attention deficit hyperactivity disorder), combined type - Plan: amphetamine-dextroamphetamine (ADDERALL) 30 MG tablet

## 2022-12-04 NOTE — Progress Notes (Signed)
  Subjective:    Jacob Bright is a 32 y.o. (DOB 01-31-1991) male.     Patient presents with  . ADHD     He will presents today to establish care.  He has a history of ADHD.  Has taken Adderall in the past.  He would like to taper off of the Adderall.  In addition he has been taking kratom for leg pain.  He is having a difficult time stopping.  He is asking about options.  No previous heart history.  No other significant physical or health complaints.     Reviewed and updated this visit by provider: Tobacco  Allergies  Meds  Problems  Med Hx  Surg Hx  Fam Hx  PDMP       Review of Systems  Objective:   Vitals:   12/04/22 1536  BP: (!) 144/92  Patient Position: Sitting  Pulse: 90  Temp: 98.2 F (36.8 C)  TempSrc: Oral  Resp: 16  Height: 6' 2 (1.88 m)  Weight: 164 lb 12.8 oz (74.8 kg)  SpO2: 98%  BMI (Calculated): 21.2  PainSc: 0-No pain   Physical Exam Constitutional:      Appearance: Normal appearance.  HENT:     Head: Normocephalic.  Cardiovascular:     Pulses: Normal pulses.     Heart sounds: Normal heart sounds.  Musculoskeletal:        General: Normal range of motion.  Pulmonary:     Effort: Pulmonary effort is normal.     Breath sounds: Normal breath sounds.  Neurological:     General: No focal deficit present.     Mental Status: He is alert and oriented to person, place, and time.  Psychiatric:        Mood and Affect: Mood normal.        Behavior: Behavior normal.        Thought Content: Thought content normal.        Judgment: Judgment normal.       Assessment / Plan:   Assessment 1. ADHD (attention deficit hyperactivity disorder), combined type (Primary) 2. Pain in both lower extremities Other orders -     amphetamine -dextroamphetamine  (ADDERALL XR) 30 MG 24 hr capsule; Take one capsule (30 mg dose) by mouth 2 (two) times daily. Max Daily Amount: 60 mg, Starting Mon 12/04/2022, Normal -     Misc. Devices MISC; Take compounded naltrexone  3 mg BID, Normal    Plan with Patient Instructions  Patient Instructions  Begin Adderall taper by switching over to extended release dosing.  Adderall XR 30 mg twice daily.  Plan to taper down by 5 mg monthly.  Taper down off the kratom.  Low-dose naltrexone 3 mg twice a day sent to compounding pharmacy.  Did discuss the opioid receptor blocking of naltrexone.  This can cause withdrawal symptoms.  Risks, benefits, and alternatives of the medications and treatment plan prescribed today were discussed, and patient expressed understanding. Plan follow-up as discussed or as needed if any worsening symptoms or change in condition.

## 2023-04-02 NOTE — Progress Notes (Signed)
  Subjective:    Jacob Bright is a 32 y.o. (DOB 03-23-1991) male.     Patient presents with  . Medication Refill    Patient presents today for medication refill of adderral     [name] presents for follow-up of ADD.  Currently stable on Adderall.  No side effects of heart palpitations, insomnia, or rebound agitation.  Request a refill.  Medication Refill     Reviewed and updated this visit by provider: Tobacco  Allergies  Meds  Problems  Med Hx  Surg Hx  Fam Hx       Review of Systems   Objective:   Vitals:   04/02/23 0819  BP: 128/88  Patient Position: Sitting  Pulse: 76  Temp: 97.9 F (36.6 C)  TempSrc: Oral  Resp: 16  Height: 6' 2 (1.88 m)  Weight: 163 lb (73.9 kg)  SpO2: 94%  BMI (Calculated): 20.9   Physical Exam Constitutional:      Appearance: Normal appearance.  HENT:     Head: Normocephalic.  Neurological:     General: No focal deficit present.     Mental Status: He is alert and oriented to person, place, and time.  Psychiatric:        Mood and Affect: Mood normal.        Behavior: Behavior normal.        Thought Content: Thought content normal.        Judgment: Judgment normal.       Assessment / Plan:   Assessment Jacob Bright was seen today for medication refill. 1. ADHD (attention deficit hyperactivity disorder), combined type (Primary)    Plan  ADD continue Adderall.  Refill provided.  Follow-up 3 months  Risks, benefits, and alternatives of the medications and treatment plan prescribed today were discussed, and patient expressed understanding. Plan follow-up as discussed or as needed if any worsening symptoms or change in condition.

## 2023-07-17 ENCOUNTER — Encounter: Payer: Self-pay | Admitting: Internal Medicine

## 2023-07-26 NOTE — Progress Notes (Signed)
  Subjective:    Jacob Bright is a 33 y.o. (DOB May 14, 1991) male.     Patient presents with  . Follow-up    Pt presents today for follow up of adderall     He will presents for follow-up of ADHD.  Currently on Adderall XR 30 mg twice daily.  This allows him to maintain daily function.  Request refill.  He also states that he is having difficulty stopping his kratom use.  With small dosage tapers he is still having side effects.  He still gets adequate sleep.     Reviewed and updated this visit by provider: Tobacco  Allergies  Meds  Problems  Med Hx  Surg Hx  Fam Hx       Review of Systems   Objective:   Vitals:   07/26/23 0813  BP: 112/78  Patient Position: Sitting  Pulse: 70  Temp: 97.6 F (36.4 C)  TempSrc: Oral  Resp: 17  Height: 6' 2 (1.88 m)  Weight: 166 lb (75.3 kg)  SpO2: 97%  BMI (Calculated): 21.3   Physical Exam Constitutional:      Appearance: Normal appearance.  HENT:     Head: Normocephalic.  Neurological:     General: No focal deficit present.     Mental Status: He is alert and oriented to person, place, and time.  Psychiatric:        Mood and Affect: Mood normal.        Behavior: Behavior normal.        Thought Content: Thought content normal.        Judgment: Judgment normal.       Assessment / Plan:   Assessment 1. ADHD (attention deficit hyperactivity disorder), combined type (Primary) -     amphetamine -dextroamphetamine  (ADDERALL XR) 30 MG 24 hr capsule; Take one capsule (30 mg dose) by mouth 2 (two) times daily. Max Daily Amount: 60 mg, Starting Thu 07/26/2023, Normal -     amphetamine -dextroamphetamine  (ADDERALL XR) 30 MG 24 hr capsule; Take one capsule (30 mg dose) by mouth 2 (two) times daily. Max Daily Amount: 60 mg, Starting Sat 08/25/2023, Normal -     amphetamine -dextroamphetamine  (ADDERALL XR) 30 MG 24 hr capsule; Take one capsule (30 mg dose) by mouth 2 (two) times daily. Max Daily Amount: 60 mg, Starting Mon 09/24/2023,  Normal 2. Substance use disorder    Plan  ADHD continue Adderall XR.  Refill provided.  Follow-up in 3 months.  Discussed his kratom use.  Discussed and reviewed treatment with Suboxone to help with cessation of kratom.  Despite the fact this is another substance and substitution it would be much better than the ups and downs of kratom.  We also discussed mood stabilization.  He does want to discontinue Adderall in the future but currently he does not want to disrupt stability of his life.  Risks, benefits, and alternatives of the medications and treatment plan prescribed today were discussed, and patient expressed understanding. Plan follow-up as discussed or as needed if any worsening symptoms or change in condition.

## 2023-08-16 NOTE — Progress Notes (Signed)
 [facility] Video Visit   Patient ID:  Jacob Bright is a 33 y.o. (DOB 1991-02-04) male Place of service: location other than patient home Patient has been advised as to the limitations and limited nature of physical exam due to nature of a video visit, the possibility of privacy risk in the use of a video visit, and that the healthcare provider may recommend visiting a healthcare clinic for in-person care and follow up.  Video Visit Assessment and Plan   1. Substance use disorder (Primary) Other orders -     buprenorphine-naloxone (SUBOXONE) 8-2 MG FILM SL film; Place one Film under the tongue 2 (two) times daily for 7 days. Max Daily Amount: 2 Film, Starting Thu 08/16/2023, Until Thu 08/23/2023, Normal    We discussed kratom and use of Suboxone to help with cessation.  He needs to taper down his lowest possible on the kratom before starting the Suboxone.  Suboxone will cause withdrawal symptoms.  He is going to begin with half a strip up to 3 times daily and then transition to 1 strip twice daily.  Follow-up in 1 week.   Outpatient Encounter Medications as of 08/16/2023:  .  amphetamine -dextroamphetamine  (ADDERALL XR) 30 MG 24 hr capsule, Take one capsule (30 mg dose) by mouth 2 (two) times daily. Max Daily Amount: 60 mg .  [START ON 08/25/2023] amphetamine -dextroamphetamine  (ADDERALL XR) 30 MG 24 hr capsule, Take one capsule (30 mg dose) by mouth 2 (two) times daily. Max Daily Amount: 60 mg .  [START ON 09/24/2023] amphetamine -dextroamphetamine  (ADDERALL XR) 30 MG 24 hr capsule, Take one capsule (30 mg dose) by mouth 2 (two) times daily. Max Daily Amount: 60 mg .  buprenorphine-naloxone (SUBOXONE) 8-2 MG FILM SL film, Place one Film under the tongue 2 (two) times daily for 7 days. Max Daily Amount: 2 Film .  [DISCONTINUED] Misc. Devices MISC, Take compounded naltrexone 3 mg BID (Patient not taking: Reported on 07/26/2023)   Risk, benefits, and alternatives were provided through patient instructions  given to the patient electronically and during the video interaction.  If any worsening symptoms or lack of improvement, the patient will seek immediate medical care.   Video Visit History  No chief complaint on file.    Jacob Bright presents for management of substance use disorder of kratom.  Kratom is an over-the-counter supplement with opioid like properties.  It is habit-forming.  There is significant withdrawals upon cessation.  He has made multiple attempts to stop without success.  He would like to begin Suboxone to help with withdrawal and cessation of kratom.      Reviewed and updated this visit by provider: Tobacco  Allergies  Meds  Problems  Med Hx  Surg Hx  Fam Hx        Review of Systems     Video Visit Objective Findings   Examination conducted with the use of video cameras/computer monitors. Vital signs and other aspects of physical exam are limited due to the nature of this encounter.   Physical Exam

## 2023-12-05 NOTE — Progress Notes (Signed)
 [facility] Video Visit   Patient ID:  Jacob Bright is a 33 y.o. (DOB 27-Nov-1990) male Place of service: patient home Patient has been advised as to the limitations and limited nature of physical exam due to nature of a video visit, the possibility of privacy risk in the use of a video visit, and that the healthcare provider may recommend visiting a healthcare clinic for in-person care and follow up.  Video Visit Assessment and Plan   1. Encounter for long-term current use of medication (Primary) 2. ADHD (attention deficit hyperactivity disorder), combined type    Patient is going to transfer his care to psychiatry due to his history of addiction We will refill Adderall XR 20 mg for twice a day prescriber Dr. Dorcus my supervising physician Discussed case in detail with Dr. Dorcus he agrees with plan Patient is aware and agrees with plan Patient will sign controlled substance agreement Patient is aware we will only prescribe Adderall for the next 3 months while he is changes over to see the psychiatrist  Patient verbalized to me that they understood what their problem is, what they need to do about it and why it is important that they do it. I have reviewed the information contained in this note and personally verified its accuracy.  I obtained the history of present illness and personally performed the physical exam Voice recognition software was used in creation of this note . Despite my best efforts at editing the text, some misspellings / errors  may have occurred.     Outpatient Encounter Medications as of 12/05/2023:  .  amphetamine -dextroamphetamine  (ADDERALL XR) 20 mg 24 hr capsule, Take one capsule (20 mg dose) by mouth 2 (two) times daily. Max Daily Amount: 40 mg .  [DISCONTINUED] amphetamine -dextroamphetamine  (ADDERALL XR) 20 mg 24 hr capsule, Take one capsule (20 mg dose) by mouth 2 (two) times daily. Max Daily Amount: 40 mg .  [DISCONTINUED] buprenorphine-naloxone (SUBOXONE) 4-1 MG  FILM SL film, Place one Film under the tongue 2 (two) times daily for 30 days. Max Daily Amount: 2 Film   Risk, benefits, and alternatives were provided through patient instructions given to the patient electronically and during the video interaction.  If any worsening symptoms or lack of improvement, the patient will seek immediate medical care.  Video Visit History  No chief complaint on file.    HPI   Patient is here for video visit he is at our office in the parking lot And I am in the office  Patient has a history of ADD he was diagnosed at age 16 and has been on Adderall or some type of ADD medicine ever since  He currently works as a Merchandiser, retail in [employer]  He was taking kratom which is an over-the-counter medicine and he admits he became addicted to it he took it for many years Recently he was able to come off the kratom by using Suboxone prescribed by Odis Sous, [place] Patient is been clean for 2 months  Patient is also decreased his Adderall dose to XR 20 mg twice a day he takes the first dose at 6 AM and the second dose at 1 PM   Patient drinks alcohol socially but no more than a few drinks a week No drugs   Patient is asking for refill of his Adderall  I discussed in detail with Dr. Baird my supervising physician that we would prefer patient is seen and managed by a psychiatrist due to his history of addiction  Patient is already scheduled to see psychiatry to take over prescribing the ED he just needs medication in the meantime  Reviewed and updated this visit by provider: Tobacco  Allergies  Meds  Problems  Med Hx  Surg Hx  Fam Hx  PDMP        Review of Systems     Video Visit Objective Findings   Examination conducted with the use of video cameras/computer monitors. Vital signs and other aspects of physical exam are limited due to the nature of this encounter.   Physical Exam Constitutional:      General: He is not in acute distress.    Appearance:  He is normal weight. He is not ill-appearing, toxic-appearing or diaphoretic.  Neurological:     Mental Status: He is alert and oriented to person, place, and time.   Psychiatric:        Mood and Affect: Mood normal.        Behavior: Behavior normal.        Thought Content: Thought content normal.        Judgment: Judgment normal.

## 2023-12-25 ENCOUNTER — Other Ambulatory Visit (HOSPITAL_COMMUNITY)
Admission: EM | Admit: 2023-12-25 | Discharge: 2023-12-28 | Disposition: A | Discharge status: Home / Self Care | Payer: Self-pay

## 2023-12-25 ENCOUNTER — Ambulatory Visit (HOSPITAL_COMMUNITY)
Admission: EM | Admit: 2023-12-25 | Discharge: 2023-12-25 | Disposition: A | Discharge status: Home / Self Care | Payer: Self-pay | Attending: Family Medicine | Admitting: Family Medicine

## 2023-12-25 DIAGNOSIS — F10230 Alcohol dependence with withdrawal, uncomplicated: Secondary | ICD-10-CM | POA: Insufficient documentation

## 2023-12-25 DIAGNOSIS — Z635 Disruption of family by separation and divorce: Secondary | ICD-10-CM | POA: Insufficient documentation

## 2023-12-25 DIAGNOSIS — F10239 Alcohol dependence with withdrawal, unspecified: Secondary | ICD-10-CM | POA: Diagnosis not present

## 2023-12-25 DIAGNOSIS — F1020 Alcohol dependence, uncomplicated: Secondary | ICD-10-CM | POA: Diagnosis present

## 2023-12-25 DIAGNOSIS — F909 Attention-deficit hyperactivity disorder, unspecified type: Secondary | ICD-10-CM | POA: Insufficient documentation

## 2023-12-25 DIAGNOSIS — R202 Paresthesia of skin: Secondary | ICD-10-CM | POA: Diagnosis not present

## 2023-12-25 DIAGNOSIS — Y909 Presence of alcohol in blood, level not specified: Secondary | ICD-10-CM | POA: Insufficient documentation

## 2023-12-25 DIAGNOSIS — Z79899 Other long term (current) drug therapy: Secondary | ICD-10-CM | POA: Insufficient documentation

## 2023-12-25 DIAGNOSIS — F1010 Alcohol abuse, uncomplicated: Secondary | ICD-10-CM

## 2023-12-25 DIAGNOSIS — R200 Anesthesia of skin: Secondary | ICD-10-CM | POA: Diagnosis not present

## 2023-12-25 DIAGNOSIS — R030 Elevated blood-pressure reading, without diagnosis of hypertension: Secondary | ICD-10-CM

## 2023-12-25 DIAGNOSIS — R400 Somnolence: Secondary | ICD-10-CM | POA: Insufficient documentation

## 2023-12-25 DIAGNOSIS — F10930 Alcohol use, unspecified with withdrawal, uncomplicated: Secondary | ICD-10-CM

## 2023-12-25 DIAGNOSIS — R55 Syncope and collapse: Secondary | ICD-10-CM | POA: Insufficient documentation

## 2023-12-25 DIAGNOSIS — F10939 Alcohol use, unspecified with withdrawal, unspecified: Secondary | ICD-10-CM

## 2023-12-25 LAB — POCT URINE DRUG SCREEN - MANUAL ENTRY (I-SCREEN)
POC Amphetamine UR: POSITIVE — AB
POC Buprenorphine (BUP): NOT DETECTED
POC Cocaine UR: NOT DETECTED
POC Marijuana UR: NOT DETECTED
POC Methadone UR: NOT DETECTED
POC Methamphetamine UR: NOT DETECTED
POC Morphine: NOT DETECTED
POC Oxazepam (BZO): POSITIVE — AB
POC Oxycodone UR: NOT DETECTED
POC Secobarbital (BAR): NOT DETECTED

## 2023-12-25 MED ORDER — NICOTINE 21 MG/24HR TD PT24
21.0000 mg | MEDICATED_PATCH | Freq: Every day | TRANSDERMAL | Status: DC
  Administered 2023-12-26: 21 mg via TRANSDERMAL
  Filled 2023-12-25 (×5): qty 1

## 2023-12-25 MED ORDER — ALUM & MAG HYDROXIDE-SIMETH 200-200-20 MG/5ML PO SUSP: 30.0000 mL | ORAL | Status: DC | PRN

## 2023-12-25 MED ORDER — ADULT MULTIVITAMIN W/MINERALS CH: 1.0000 | ORAL_TABLET | Freq: Every day | ORAL | Status: DC

## 2023-12-25 MED ORDER — HYDROXYZINE HCL 25 MG PO TABS: 25.0000 mg | ORAL_TABLET | Freq: Three times a day (TID) | ORAL | Status: DC | PRN

## 2023-12-25 MED ORDER — DIPHENHYDRAMINE HCL 50 MG/ML IJ SOLN: 50.0000 mg | Freq: Three times a day (TID) | INTRAMUSCULAR | Status: DC | PRN

## 2023-12-25 MED ORDER — HALOPERIDOL 5 MG PO TABS: 5.0000 mg | ORAL_TABLET | Freq: Three times a day (TID) | ORAL | Status: DC | PRN

## 2023-12-25 MED ORDER — THIAMINE MONONITRATE 100 MG PO TABS
100.0000 mg | ORAL_TABLET | Freq: Every day | ORAL | Status: DC
  Administered 2023-12-26 – 2023-12-28 (×3): 100 mg via ORAL
  Filled 2023-12-25 (×3): qty 1

## 2023-12-25 MED ORDER — THIAMINE HCL 100 MG/ML IJ SOLN
100.0000 mg | Freq: Once | INTRAMUSCULAR | Status: AC
  Administered 2023-12-25: 100 mg via INTRAMUSCULAR
  Filled 2023-12-25: qty 2

## 2023-12-25 MED ORDER — HALOPERIDOL LACTATE 5 MG/ML IJ SOLN: 10.0000 mg | Freq: Three times a day (TID) | INTRAMUSCULAR | Status: DC | PRN

## 2023-12-25 MED ORDER — HYDROXYZINE HCL 25 MG PO TABS: 25.0000 mg | ORAL_TABLET | Freq: Four times a day (QID) | ORAL | Status: DC | PRN

## 2023-12-25 MED ORDER — MAGNESIUM HYDROXIDE 400 MG/5ML PO SUSP: 30.0000 mL | Freq: Every day | ORAL | Status: DC | PRN

## 2023-12-25 MED ORDER — LORAZEPAM 1 MG PO TABS: 1.0000 mg | ORAL_TABLET | Freq: Four times a day (QID) | ORAL | Status: DC

## 2023-12-25 MED ORDER — HALOPERIDOL LACTATE 5 MG/ML IJ SOLN: 5.0000 mg | Freq: Three times a day (TID) | INTRAMUSCULAR | Status: DC | PRN

## 2023-12-25 MED ORDER — NICOTINE 21 MG/24HR TD PT24: 21.0000 mg | MEDICATED_PATCH | Freq: Every day | TRANSDERMAL | Status: DC

## 2023-12-25 MED ORDER — LOPERAMIDE HCL 2 MG PO CAPS: 2.0000 mg | ORAL_CAPSULE | ORAL | Status: DC | PRN

## 2023-12-25 MED ORDER — QUETIAPINE FUMARATE 25 MG PO TABS
25.0000 mg | ORAL_TABLET | Freq: Two times a day (BID) | ORAL | Status: DC
  Administered 2023-12-25: 25 mg via ORAL
  Filled 2023-12-25: qty 1

## 2023-12-25 MED ORDER — LORAZEPAM 1 MG PO TABS: 1.0000 mg | ORAL_TABLET | Freq: Four times a day (QID) | ORAL | Status: DC | PRN

## 2023-12-25 MED ORDER — ONDANSETRON 4 MG PO TBDP: 4.0000 mg | ORAL_TABLET | Freq: Four times a day (QID) | ORAL | Status: AC | PRN

## 2023-12-25 MED ORDER — ONDANSETRON 4 MG PO TBDP
8.0000 mg | ORAL_TABLET | Freq: Once | ORAL | Status: AC
  Administered 2023-12-25: 8 mg via ORAL
  Filled 2023-12-25: qty 2

## 2023-12-25 MED ORDER — LORAZEPAM 2 MG/ML IJ SOLN: 2.0000 mg | Freq: Three times a day (TID) | INTRAMUSCULAR | Status: DC | PRN

## 2023-12-25 MED ORDER — DIPHENHYDRAMINE HCL 50 MG PO CAPS: 50.0000 mg | ORAL_CAPSULE | Freq: Three times a day (TID) | ORAL | Status: DC | PRN

## 2023-12-25 MED ORDER — LORAZEPAM 1 MG PO TABS
1.0000 mg | ORAL_TABLET | Freq: Three times a day (TID) | ORAL | Status: AC
  Administered 2023-12-26 (×3): 1 mg via ORAL
  Filled 2023-12-25 (×3): qty 1

## 2023-12-25 MED ORDER — ACETAMINOPHEN 325 MG PO TABS: 650.0000 mg | ORAL_TABLET | Freq: Four times a day (QID) | ORAL | Status: DC | PRN

## 2023-12-25 MED ORDER — LORAZEPAM 1 MG PO TABS: 1.0000 mg | ORAL_TABLET | Freq: Four times a day (QID) | ORAL | Status: AC | PRN

## 2023-12-25 MED ORDER — LORAZEPAM 1 MG PO TABS: 1.0000 mg | ORAL_TABLET | Freq: Every day | ORAL | Status: DC

## 2023-12-25 MED ORDER — DIPHENHYDRAMINE HCL 50 MG PO CAPS
50.0000 mg | ORAL_CAPSULE | Freq: Three times a day (TID) | ORAL | Status: DC | PRN
Start: 2023-12-25 — End: 2023-12-28

## 2023-12-25 MED ORDER — THIAMINE MONONITRATE 100 MG PO TABS: 100.0000 mg | ORAL_TABLET | Freq: Every day | ORAL | Status: DC

## 2023-12-25 MED ORDER — LORAZEPAM 1 MG PO TABS
1.0000 mg | ORAL_TABLET | Freq: Every day | ORAL | Status: AC
  Administered 2023-12-28: 1 mg via ORAL
  Filled 2023-12-25: qty 1

## 2023-12-25 MED ORDER — THIAMINE HCL 100 MG/ML IJ SOLN: 100.0000 mg | Freq: Once | INTRAMUSCULAR | Status: DC

## 2023-12-25 MED ORDER — ONDANSETRON 4 MG PO TBDP: 4.0000 mg | ORAL_TABLET | Freq: Four times a day (QID) | ORAL | Status: DC | PRN

## 2023-12-25 MED ORDER — QUETIAPINE FUMARATE 25 MG PO TABS: 25.0000 mg | ORAL_TABLET | Freq: Two times a day (BID) | ORAL | Status: DC

## 2023-12-25 MED ORDER — LORAZEPAM 1 MG PO TABS: 1.0000 mg | ORAL_TABLET | Freq: Two times a day (BID) | ORAL | Status: DC

## 2023-12-25 MED ORDER — LORAZEPAM 1 MG PO TABS
1.0000 mg | ORAL_TABLET | Freq: Two times a day (BID) | ORAL | Status: AC
  Administered 2023-12-27 (×2): 1 mg via ORAL
  Filled 2023-12-25 (×2): qty 1

## 2023-12-25 MED ORDER — LOPERAMIDE HCL 2 MG PO CAPS: 2.0000 mg | ORAL_CAPSULE | ORAL | Status: AC | PRN

## 2023-12-25 MED ORDER — LORAZEPAM 1 MG PO TABS: 1.0000 mg | ORAL_TABLET | Freq: Three times a day (TID) | ORAL | Status: DC

## 2023-12-25 MED ORDER — CLONIDINE HCL 0.1 MG PO TABS
0.1000 mg | ORAL_TABLET | Freq: Two times a day (BID) | ORAL | Status: DC | PRN
  Administered 2023-12-25: 0.1 mg via ORAL
  Filled 2023-12-25: qty 1

## 2023-12-25 MED ORDER — QUETIAPINE FUMARATE 25 MG PO TABS
25.0000 mg | ORAL_TABLET | Freq: Every day | ORAL | Status: DC
  Administered 2023-12-25 – 2023-12-26 (×2): 25 mg via ORAL
  Filled 2023-12-25 (×2): qty 1

## 2023-12-25 MED ORDER — ADULT MULTIVITAMIN W/MINERALS CH
1.0000 | ORAL_TABLET | Freq: Every day | ORAL | Status: DC
  Administered 2023-12-25 – 2023-12-28 (×4): 1 via ORAL
  Filled 2023-12-25 (×4): qty 1

## 2023-12-25 MED ORDER — LORAZEPAM 1 MG PO TABS
1.0000 mg | ORAL_TABLET | Freq: Four times a day (QID) | ORAL | Status: AC
  Administered 2023-12-25 (×4): 1 mg via ORAL
  Filled 2023-12-25 (×4): qty 1

## 2023-12-25 NOTE — Group Note (Signed)
 Group Topic: Relapse and Recovery  Group Date: 12/25/2023 Start Time: 1941 End Time: 2040 Facilitators: Joan Plowman B  Department: [facility]  Number of Participants: 4  Group Focus: abuse issues, acceptance, and activities of daily living skills Treatment Modality:  Spiritual Interventions utilized were leisure development, patient education, story telling, and support Purpose: express feelings, relapse prevention strategies, and trigger / craving management  Name: Jacob Bright Date of Birth: Aug 26, 1990  MR: [id number]    Level of Participation: PT DID NOT ATTEND GROUP Quality of Participation: cooperative Interactions with others: gave feedback Mood/Affect: appropriate Triggers (if applicable): NA Cognition: coherent/clear Progress: None Response: NA Plan: patient will be encouraged to go to groups.   Patients Problems:  Patient Active Problem List   Diagnosis Date Noted   Alcohol use disorder, severe, dependence (HCC) 12/25/2023   Family history of prostate cancer in father 09/16/2021   Seasonal allergic rhinitis due to pollen 09/16/2021   Chews tobacco 08/13/2019   Chronic tonsillitis 10/26/2014   Acute mechanical low back pain with duration of less than six weeks 08/13/2014   ADHD (attention deficit hyperactivity disorder), combined type 06/12/2013

## 2023-12-25 NOTE — ED Provider Notes (Signed)
 Facility Based Crisis Admission H&P  Date: 12/25/23 Patient Name: Jacob Bright MRN: [id number] Chief Complaint: Alcohol withdrawal. I want to detox.  Diagnoses:  Final diagnoses:  Alcohol use disorder, severe, dependence (HCC)  Alcohol withdrawal syndrome with complication (HCC)  Attention deficit hyperactivity disorder (ADHD), unspecified ADHD type    HPI: 33 year old male presented to Urgent care complaining of alcohol withdrawal symptoms. He is going through a divorce and this led to a relapse with alcohol about 2 months ago. He complains of shaking and numbness and tingling. He reports problems with dropping things. He is not sleeping other than drinking himself to sleep. He reports drinking 1/5 daily.   PHQ 2-9:   Flowsheet Row ED from 12/25/2023 in [facility] Most recent reading at 12/25/2023 10:00 AM ED from 12/25/2023 in [facility] Most recent reading at 12/25/2023  8:51 AM UC from 08/17/2021 in [facility] at [facility]  Most recent reading at 08/17/2021  8:50 AM  C-SSRS RISK CATEGORY No Risk No Risk No Risk      Total Time spent with patient: 45 minutes  Musculoskeletal  Strength & Muscle Tone: within normal limits Gait & Station: normal Patient leans: N/A  Psychiatric Specialty Exam  Presentation General Appearance:  Appropriate for Environment  Eye Contact: Fair  Speech: Clear and Coherent  Speech Volume: Normal  Handedness: -- (did not assess)   Mood and Affect  Mood: Anxious; Depressed  Affect: Congruent   Thought Process  Thought Processes: Coherent  Descriptions of Associations:Intact  Orientation:Full (Time, Place and Person)  Thought Content:WDL  Diagnosis of Schizophrenia or Schizoaffective disorder in past: No   Hallucinations:Hallucinations: None  Ideas of Reference:None  Suicidal Thoughts:Suicidal Thoughts: No  Homicidal Thoughts:Homicidal Thoughts:  No   Sensorium  Memory: Immediate Good; Recent Good; Remote Good  Judgment: Fair  Insight:No data recorded  Executive Functions  Concentration: Fair  Attention Span: Fair  Recall: [employer] of Knowledge: Fair  Language: Fair   Psychomotor Activity  Psychomotor Activity: Psychomotor Activity: Normal   Assets  Assets: Communication Skills; Desire for Improvement; Resilience   Sleep  Sleep: Sleep: Poor   Nutritional Assessment (For OBS and FBC admissions only) Has the patient had a weight loss or gain of 10 pounds or more in the last 3 months?: No Has the patient had a decrease in food intake/or appetite?: Yes Does the patient have dental problems?: No Does the patient have eating habits or behaviors that may be indicators of an eating disorder including binging or inducing vomiting?: No Has the patient recently lost weight without trying?: 0 Has the patient been eating poorly because of a decreased appetite?: 0 Malnutrition Screening Tool Score: 0    Physical Exam Vitals reviewed.  Constitutional:      Appearance: He is normal weight.  HENT:     Head: Normocephalic and atraumatic.     Right Ear: External ear normal.     Left Ear: External ear normal.     Nose: Nose normal.     Mouth/Throat:     Mouth: Mucous membranes are moist.  Cardiovascular:     Rate and Rhythm: Normal rate and regular rhythm.     Heart sounds: Normal heart sounds.  Pulmonary:     Effort: Pulmonary effort is normal. No respiratory distress.  Skin:    General: Skin is warm and dry.  Neurological:     Mental Status: He is alert and oriented to  person, place, and time.  Psychiatric:        Thought Content: Thought content normal.    Review of Systems  Constitutional: Negative.   HENT: Negative.    Eyes: Negative.   Respiratory: Negative.    Cardiovascular: Negative.   Gastrointestinal: Negative.   Musculoskeletal:  Positive for back pain.  Skin: Negative.    Neurological:  Positive for tingling, tremors and sensory change.  Psychiatric/Behavioral:  Positive for depression and suicidal ideas. The patient is nervous/anxious and has insomnia.     Blood pressure (!) 140/100, pulse 88, temperature 99 F (37.2 C), temperature source Tympanic, resp. rate 18, SpO2 99%. There is no height or weight on file to calculate BMI.  Past Psychiatric History: He has a history of ADHD and treated with Adderall.  Alcohol dep in the past, Kratom Dep, Cocaine dep, He has a history of completing treatment at [facility] several years ago. He was sober with only occasional ETOH use until the past month when he started to drink 1/5 daily due to marital discord and pending divorce.  He has a family medicine provider that prescribes Adderall.  Took Kratom for several years and came off of Kratom with Suboxone prescribed by Odis Sous, PA    Is the patient at risk to self? No  Has the patient been a risk to self in the past 6 months? No .    Has the patient been a risk to self within the distant past? No   Is the patient a risk to others? No   Has the patient been a risk to others in the past 6 months? No   Has the patient been a risk to others within the distant past? No   Past Medical History: Exercise induced Asthma, Tonsillectomy, Spine misalignment and back pain, Ankle surgery, Bilateral inferior turbinate reduction Family History: not discussed Social History: Pt is married and in the midst of a divorce. 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  He is a former Chemical engineer.  Last Labs:  Admission on 12/25/2023, Discharged on 12/25/2023  Component Date Value Ref Range Status   POC Amphetamine  UR 12/25/2023 Positive (A)  NONE DETECTED (Cut Off Level 1000 ng/mL) Final   POC Secobarbital (BAR) 12/25/2023 None Detected  NONE DETECTED (Cut Off Level 300 ng/mL) Final   POC Buprenorphine (BUP)  12/25/2023 None Detected  NONE DETECTED (Cut Off Level 10 ng/mL) Final   POC Oxazepam (BZO) 12/25/2023 Positive (A)  NONE DETECTED (Cut Off Level 300 ng/mL) Final   POC Cocaine UR 12/25/2023 None Detected  NONE DETECTED (Cut Off Level 300 ng/mL) Final   POC Methamphetamine UR 12/25/2023 None Detected  NONE DETECTED (Cut Off Level 1000 ng/mL) Final   POC Morphine  12/25/2023 None Detected  NONE DETECTED (Cut Off Level 300 ng/mL) Final   POC Methadone UR 12/25/2023 None Detected  NONE DETECTED (Cut Off Level 300 ng/mL) Final   POC Oxycodone  UR 12/25/2023 None Detected  NONE DETECTED (Cut Off Level 100 ng/mL) Final   POC Marijuana UR 12/25/2023 None Detected  NONE DETECTED (Cut Off Level 50 ng/mL) Final    Allergies: Patient has no known allergies.  Medications:  Facility Ordered Medications  Medication   [COMPLETED] ondansetron  (ZOFRAN -ODT) disintegrating tablet 8 mg   acetaminophen  (TYLENOL ) tablet 650 mg   alum & mag hydroxide-simeth (MAALOX/MYLANTA) 200-200-20 MG/5ML suspension 30 mL   magnesium  hydroxide (MILK OF MAGNESIA) suspension 30 mL  haloperidol  (HALDOL ) tablet 5 mg   And   diphenhydrAMINE  (BENADRYL ) capsule 50 mg   haloperidol  lactate (HALDOL ) injection 5 mg   And   diphenhydrAMINE  (BENADRYL ) injection 50 mg   And   LORazepam  (ATIVAN ) injection 2 mg   haloperidol  lactate (HALDOL ) injection 10 mg   And   diphenhydrAMINE  (BENADRYL ) injection 50 mg   And   LORazepam  (ATIVAN ) injection 2 mg   hydrOXYzine  (ATARAX ) tablet 25 mg   loperamide  (IMODIUM ) capsule 2-4 mg   LORazepam  (ATIVAN ) tablet 1 mg   LORazepam  (ATIVAN ) tablet 1 mg   Followed by   NOREEN ON 12/26/2023] LORazepam  (ATIVAN ) tablet 1 mg   Followed by   NOREEN ON 12/27/2023] LORazepam  (ATIVAN ) tablet 1 mg   Followed by   NOREEN ON 12/28/2023] LORazepam  (ATIVAN ) tablet 1 mg   magnesium  hydroxide (MILK OF MAGNESIA) suspension 30 mL   multivitamin with minerals tablet 1 tablet   ondansetron  (ZOFRAN -ODT) disintegrating  tablet 4 mg   [COMPLETED] thiamine  (VITAMIN B1) injection 100 mg   [START ON 12/26/2023] thiamine  (VITAMIN B1) tablet 100 mg   nicotine  (NICODERM CQ  - dosed in mg/24 hours) patch 21 mg   QUEtiapine  (SEROQUEL ) tablet 25 mg   cloNIDine  (CATAPRES ) tablet 0.1 mg   PTA Medications  Medication Sig   amphetamine -dextroamphetamine  (ADDERALL XR) 20 MG 24 hr capsule Take 20 mg by mouth 2 (two) times daily.    Long Term Goals: Improvement in symptoms so as ready for discharge  Short Term Goals: Patient will no longer have withdrawal symptoms.  Medical Decision Making  33 year old male with a history of substance use problems and ADHD who has a medical history of injuries, spine misalignment and exercise induced asthma presents for symptoms of alcohol withdrawal wanting to have a period of monitoring and treatment for acute symptoms. He wants outpatient rehabilitation services after discharge. Pt reports being very sedated and tired from medication given today. He complains of numbness and tingling of upper and lower extremities. He has no detected focal neurological deficits. He complains of right hand numbness forth and fifth digits but also points to left hand. He is awake watching a movie but falls asleep during interview lying on his bed.  Numbness and tingling Reassess daily   Daytime somnolence Patient feels Seroquel  during the day may be causing sedation. Switch to at night only.  CIWA-Ar 1240 2 Pt is having minimal signs and symptoms of withdrawal and is comfortable. Continue to monitor and treat per the alcohol withdrawal protocol.  Monitor mood and anxiety symptoms.  Dispo: substance use treatment IOP         Recommendations  Based on my evaluation the patient does not appear to have an emergency medical condition.  Garvin JINNY Gaines, MD 12/25/23  11:48 AM

## 2023-12-25 NOTE — ED Provider Notes (Signed)
 Behavioral Health Urgent Care Medical Screening Exam  Patient Name: Jacob Bright MRN: [id number] Date of Evaluation: 12/25/23 Chief Complaint:  I last drank 4 pm yesterday, pint of wild [place] Diagnosis:  Final diagnoses:  Alcohol use disorder, severe, dependence (HCC)  Alcohol withdrawal syndrome without complication (HCC)   History of Present illness: Jacob Bright is a 33 y.o. male, history of alcohol use dependence, distance hx of cocaine addiction, hx of Kratom addiction, and ADHD (current treatment with Adderall (prescribed by PCP x 3 months).Patient presented to [facility] as a walk in today, voluntarily,  accompanied by his mother with complaints of active alcohol withdrawal symptoms. Patient seeking short term treatment for alcohol detox and outpatient treatment for alcohol dependence at discharge.   Jacob Bright, 33 y.o., male patient seen face to face by this provider, consulted with Dr. Leigh; and chart reviewed on 12/25/23.    On evaluation Jacob Bright reports that I last drank 4 pm yesterday, pint of wild [place] and reports immediately experiencing withdrawal symptoms after finishing the bottle of liquor at 4 pm yesterday. Patient has a history of kratom dependence/addiction and recently was able to stop Kratom as of April 2025  with Suboxone treatment.  Patient reports that he has been drinking a 5th of liquor daily for the 30 days as his marriage of 3 years recently ended and they're now in a pending divorce.  He reports the emotional devastation and depression associated with his relationship ending as the driving force into his recent dependence on alcohol.  Patient reports a distant history of alcohol dependence while in college in addition to being dependent on cocaine.  Patient reports that he has been treated and checked into [facility] several years back to treat his cocaine addiction and alcohol addiction.  He denies any history of relapsing on cocaine and reports up until a  month ago he had not become dependent on alcohol.  Patient was also prescribed Seroquel  in the past and reports that after being of [facility] he was prescribed Seroquel  to help with sleep and he is open to restarting given his current symptoms of depression as well.  He is not sleeping due to the alcohol consumption and reports that he only sleeps when he passes out from drinking.  Despite passing out from drinking he only sleeps a few hours. Patient reports he has had little to no sleep within the last 72 hours.Current withdrawal symptoms include, tremors, nausea, poor appetite, and endorses feeling generally unwell.  Patient endorses that he would like to detox however does not have a desire at this time to transfer into a long-term residential treatment program.  He is open to doing outpatient substance treatment program.  Patient denies any history of complicated withdrawal or seizure history.  Patient denies any chronic conditions.  Patient denies any recent substance use and endorses that his last dose of Adderall was 2 days ago.   During evaluation Jacob Bright is in no acute distress.  Patient is alert/oriented x 4; anxious, cooperative; and mood congruent with affect.  Patient is speaking in a clear tone at moderate volume, and normal pace; with fair eye contact.  Patient thought process is coherent and relevant; There is no indication that patient is currently responding to internal/external stimuli or experiencing delusional thought content; and patient denies  suicidal/self-harm/homicidal ideation, psychosis, and paranoia.  Patient has remained calm throughout assessment and has answered questions appropriately.  Patient will be directly admitted to  the facility based crisis center for alcohol detox with CIWA protocol with an Ativan  taper.  Discussed with patient restarting Seroquel  given active symptoms of depression in addition to severe tremors and anxiety.  Patient agreed to restart Seroquel   will restart 25 mg twice daily to reduce symptoms and help facilitate rest and sleep.  Patient does appear to be experiencing a moderate withdrawal symptoms patient will be closely monitored to ensure no complications related to withdrawing from alcohol. Flowsheet Row ED from 12/25/2023 in [facility] UC from 08/17/2021 in [facility] at [facility]   C-SSRS RISK CATEGORY No Risk No Risk    Psychiatric Specialty Exam  Presentation  General Appearance:Appropriate for Environment  Eye Contact:Fair  Speech:Clear and Coherent   Speech Volume:Normal  Handedness:-- (did not assess)   Mood and Affect  Mood:Anxious; Depressed  Affect:Congruent   Thought Process  Thought Processes:Coherent  Descriptions of Associations:Intact  Orientation:Full (Time, Place and Person)  Thought Content:WDL    Hallucinations:None  Ideas of Reference:None  Suicidal Thoughts:No  Homicidal Thoughts:No   Sensorium  Memory:Immediate Good; Recent Good; Remote Good  Judgment:Fair  Insight:No data recorded  Executive Functions  Concentration:Fair  Attention Span:Fair  Recall:Fair  Fund of Knowledge:Fair  Language:Fair   Psychomotor Activity  Psychomotor Activity:Normal   Assets  Assets:Communication Skills; Desire for Improvement; Resilience   Sleep  Sleep:Poor  Number of hours: few hours  Physical Exam: Physical Exam Vitals reviewed.  Neurological:     Mental Status: He is alert.     Review of Systems  Gastrointestinal:  Positive for nausea.  Neurological:  Positive for tremors.  Psychiatric/Behavioral:  Positive for depression and substance abuse. Negative for hallucinations, memory loss and suicidal ideas. The patient is nervous/anxious and has insomnia.     Blood pressure (!) 130/100, pulse (!) 113, temperature 97.8 F (36.6 C), temperature source Oral, resp. rate 16, SpO2 99%. There is no height or weight on file to  calculate BMI.  Musculoskeletal: Strength & Muscle Tone: within normal limits Gait & Station: normal Patient leans: N/A   [facility] MSE Discharge Disposition for Follow up and Recommendations: Based on my evaluation I certify that psychiatric inpatient services furnished can reasonably be expected to improve the patient's condition which I recommend transfer to an appropriate accepting facility.  1. Alcohol use disorder, severe, dependence (HCC) (Primary) 2. Alcohol withdrawal syndrome without complication (HCC) -UDS positive for benzodiazepines(UDS was collected and resulted prior to patient receiving any Ativan  during this admission).  UDS positive for amphetamines, patient has an active prescription for Adderall which was filled on 12/05/2023, EKG shows normal sinus rhythm, heart rate 86, QTc 430 CIWA protocol with Ativan  taper prescribed for alcohol withdrawal symptoms -Vitamin B12 replacement ordered given excessive alcohol use  Labs pending: TSH, due to worsening depression and rule out hypothyroidism Lipid, standard lab ordered for all patients received an antipsychotic therapy Ethanol, standard order for all patient reporting alcohol misuse or overuse Hemoglobin A1c, standard lab ordered for all patients received an antipsychotic therapy CBC, standing to rule out any infection, immunosuppression or anemia CMP, rule out metabolic conditions which may be contributing to mental health symptoms  PRNS -Start Hydroxyzine  25 mg TID PRN for anxiety -Continue Tylenol  650 mg every 6 hours PRN for mild pain -Continue Maalox 30 mg every 4 hrs PRN for indigestion -Continue Milk of Magnesia as needed every 6 hrs for constipation -Continue Agitation protocol meds as per the MAR-See MAR for details   3. Elevated  BP without diagnosis of hypertension  -Clonidine  0.1 twice daily as needed BP systolic 150 or greater, BP diastolic 90 or greater  - Recheck BP 30 minutes after clonidine  is  given  Patient has been accepted and will transfer from [facility] to facility based crisis unit for alcohol detox.   Suzen Lesches, NP 12/25/2023, 9:19 AM

## 2023-12-25 NOTE — BH Assessment (Addendum)
 Comprehensive Clinical Assessment (CCA) Note  12/25/2023 LAIRD RUNNION [id number]  Disposition: Per Suzen Lesches, NP admission to [facility] is recommended.   The patient demonstrates the following risk factors for suicide: Chronic risk factors for suicide include: psychiatric disorder of Situational Depression and substance use disorder. Acute risk factors for suicide include: family or marital conflict. Protective factors for this patient include: positive social support, positive therapeutic relationship, responsibility to others (children, family), and hope for the future. Considering these factors, the overall suicide risk at this point appears to be low. Patient is appropriate for outpatient follow up, once stabilized.   Patient is a 33 y.o. male with a history of Alcohol Use Disorder, moderate and situational depression who presents in active alcohol withdrawal to [facility] for assessment.  Patient presents voluntarily accompanied by his mother. Patient reports increased alcohol use since going through a divorce for the past month.  Patient reports current withdrawal symptoms of sweats, nausea, tingling, tremors and weakness/unsteady gait.  He reports he has been drinking 1/5 of liquor daily for the past month, with last drink yesterday around 4pm. Patient states an additional stressor this past week was learning his wife left with their dog while he was on vacation with family.  Patient reports he has a history of cocaine use and alcohol use during his college years.  He completed residential SA treatment at [facility] 10 years ago. Patient denies psychiatric history outside of ADHD.  He is followed by his PCP with [facility] in [place] for med management.  Patient reports he is Rx Adderall 20mg  BID.  Patient denies SI, HI and AVH. He is seeking detox treatment.    Chief Complaint: No chief complaint on file.  Visit Diagnosis: Alcohol Use Disorder, moderate                              Situational Depression   CCA Screening, Triage and Referral (STR)  Patient Reported Information How did you hear about us ? Self  What Is the Reason for Your Visit/Call Today? Patient is a 33 y.o. male with a history of polysubstance abuse who presents intoxicated.  Patient reports increased alcohol use since going through a divorce for the past month.  He reports he has been drinking 1/5 of liquor daily for the past month.  Patient states an additional stressor this past week was learning his wife left with their dog while he was on vacation with family.  Patient denies SI, HI and AVH. He is seeking detox treatment.  How Long Has This Been Causing You Problems? 1-6 months  What Do You Feel Would Help You the Most Today? Alcohol or Drug Use Treatment; Treatment for Depression or other mood problem   Have You Recently Had Any Thoughts About Hurting Yourself? No  Are You Planning to Commit Suicide/Harm Yourself At This time? No   Flowsheet Row ED from 12/25/2023 in [facility] UC from 08/17/2021 in [facility] at [facility]   C-SSRS RISK CATEGORY No Risk No Risk    Have you Recently Had Thoughts About Hurting Someone Sherral? No  Are You Planning to Harm Someone at This Time? No  Explanation: N/A   Have You Used Any Alcohol or Drugs in the Past 24 Hours? Yes  How Long Ago Did You Use Drugs or Alcohol? Yesterday What Did You Use and How Much? Patient drank a pint of liquor yesterday -  last drink around 4pm   Do You Currently Have a Therapist/Psychiatrist? No  Name of Therapist/Psychiatrist:    Have You Been Recently Discharged From Any Office Practice or Programs? No  Explanation of Discharge From Practice/Program: N/A    CCA Screening Triage Referral Assessment Type of Contact: Face-to-Face  Telemedicine Service Delivery:   Is this Initial or Reassessment?   Date Telepsych consult ordered in [facility]:    Time Telepsych consult ordered  in [facility]:    Location of Assessment: [facility] Assessment Services  Provider Location: [facility] Assessment Services   Collateral Involvement: None   Does Patient Have a Automotive engineer Guardian? No  Legal Guardian Contact Information: N/A  Copy of Legal Guardianship Form: -- (N/A)  Legal Guardian Notified of Arrival: -- (N/A)  Legal Guardian Notified of Pending Discharge: -- (N/A)  If Minor and Not Living with Parent(s), Who has Custody? N/A  Is CPS involved or ever been involved? Never  Is APS involved or ever been involved? Never   Patient Determined To Be At Risk for Harm To Self or Others Based on Review of Patient Reported Information or Presenting Complaint? No  Method: -- (N/A, no HI)  Availability of Means: -- (N/A, no HI)  Intent: -- (N/A, no HI)  Notification Required: -- (N/A, no HI)  Additional Information for Danger to Others Potential: -- (N/A, no HI)  Additional Comments for Danger to Others Potential: N/A, no HI  Are There Guns or Other Weapons in Your Home? No  Types of Guns/Weapons: N/A  Are These Weapons Safely Secured?                            -- (N/A)  Who Could Verify You Are Able To Have These Secured: N/A  Do You Have any Outstanding Charges, Pending Court Dates, Parole/Probation? None  Contacted To Inform of Risk of Harm To Self or Others: Family/Significant Other:    Does Patient Present under Involuntary Commitment? No    [place] of Residence: [place]   Patient Currently Receiving the Following Services: Not Receiving Services   Determination of Need: Urgent (48 hours)   Options For Referral: Facility-Based Crisis; Outpatient Therapy; Medication Management; Chemical Dependency Intensive Outpatient Therapy (CDIOP)     CCA Biopsychosocial Patient Reported Schizophrenia/Schizoaffective Diagnosis in Past: No   Strengths: Patient is seeking treatment and has family support.   Mental Health Symptoms Depression:   Sleep (too much or little); Increase/decrease in appetite; Difficulty Concentrating; Hopelessness   Duration of Depressive symptoms: Duration of Depressive Symptoms: Greater than two weeks   Mania:  None   Anxiety:   Worrying; Tension; Sleep   Psychosis:  None   Duration of Psychotic symptoms:    Trauma:  None   Obsessions:  None   Compulsions:  None   Inattention:  N/A   Hyperactivity/Impulsivity:  N/A   Oppositional/Defiant Behaviors:  N/A   Emotional Irregularity:  N/A  Other Mood/Personality Symptoms:  Situational depression related to divorce    Mental Status Exam Appearance and self-care  Stature:  Tall   Weight:  Average weight   Clothing:  Casual   Grooming:  Normal   Cosmetic use:  None   Posture/gait:  Slumped   Motor activity:  Slowed; Tremor   Sensorium  Attention:  Normal   Concentration:  Normal   Orientation:  X5   Recall/memory:  Normal   Affect and Mood  Affect:  Flat  Mood:  Depressed   Relating  Eye contact:  Fleeting   Facial expression:  Sad; Responsive   Attitude toward examiner:  Cooperative   Thought and Language  Speech flow: Clear and Coherent   Thought content:  Appropriate to Mood and Circumstances   Preoccupation:  None   Hallucinations:  None   Organization:  Intact   [employer] of Knowledge:  Average   Intelligence:  Average   Abstraction:  Normal  Judgement:  Impaired   Reality Testing:  Adequate   Insight:  Gaps; Fair   Decision Making:  Impulsive; Vacilates   Social Functioning  Social Maturity:  Responsible   Social Judgement:  Normal   Stress  Stressors:  Relationship   Coping Ability:  Contractor Deficits:  Interpersonal   Supports:  Family     Religion: Religion/Spirituality Are You A Religious Person?:  (NA) How Might This Affect Treatment?: NA  Leisure/Recreation: Leisure / Recreation Do You Have Hobbies?:  No  Exercise/Diet: Exercise/Diet Do You Exercise?:  (NA) Have You Gained or Lost A Significant Amount of Weight in the Past Six Months?:  (NA) Do You Follow a Special Diet?: No Do You Have Any Trouble Sleeping?: Yes Explanation of Sleeping Difficulties: Patient reports he passes out from binge drinking and then wakes later, unable to sleep.   CCA Employment/Education Employment/Work Situation: Employment / Work Situation Employment Situation: Employed Work Stressors: None reported Patient's Job has Been Impacted by Current Illness: No Has Patient ever Been in Equities trader?: No  Education: Education Is Patient Currently Attending School?: No Last Grade Completed: 12 Did You Product manager?:  (NA) Did You Have An Individualized Education Program (IIEP):  (NA) Did You Have Any Difficulty At School?:  (NA) Patient's Education Has Been Impacted by Current Illness: No   CCA Family/Childhood History Family and Relationship History: Family history Marital status: Separated Separated, when?: 1 month ago What types of issues is patient dealing with in the relationship?: Patient does not elaborate Additional relationship information: NA Does patient have children?: No  Childhood History:  Childhood History By whom was/is the patient raised?: Both parents Did patient suffer any verbal/emotional/physical/sexual abuse as a child?: No Did patient suffer from severe childhood neglect?: No Has patient ever been sexually abused/assaulted/raped as an adolescent or adult?: No Was the patient ever a victim of a crime or a disaster?: No Witnessed domestic violence?: No Has patient been affected by domestic violence as an adult?: No    CCA Substance Use Alcohol/Drug Use: Alcohol / Drug Use Pain Medications: See MAR Prescriptions: See MAR Over the Counter: See MAR History of alcohol / drug use?: Yes Longest period of sobriety (when/how long): NA Negative Consequences of Use:  Personal relationships Withdrawal Symptoms: Nausea / Vomiting, Weakness, Tremors, Tingling, Sweats, Change in blood pressure Substance #1 Name of Substance 1: ETOH 1 - Age of First Use: 20 1 - Amount (size/oz): 1/5 of liquor 1 - Frequency: daily 1 - Duration: 1 month 1 - Last Use / Amount: yesterday - 1 pint of liquor 1 - Method of Aquiring: purchases 1- Route of Use: drinks      ASAM's:  Six Dimensions of Multidimensional Assessment  Dimension 1:  Acute Intoxication and/or Withdrawal Potential:   Dimension 1:  Description of individual's past and current experiences of substance use and withdrawal: Patient currently in alcohol withdrawal  Dimension 2:  Biomedical Conditions and Complications:   Dimension 2:  Description of patient's biomedical conditions and  complications: No medical concerns  Dimension 3:  Emotional, Behavioral, or Cognitive Conditions and Complications:  Dimension 3:  Description of emotional, behavioral, or cognitive conditions and complications: situatioonal depression  Dimension 4:  Readiness to Change:  Dimension 4:  Description of Readiness to Change criteria: Patient is seeking treatment  Dimension 5:  Relapse, Continued use, or Continued Problem Potential:  Dimension 5:  Relapse, continued use, or continued problem potential critiera description: Trigger for relapse is situational  Dimension 6:  Recovery/Living Environment:  Dimension 6:  Recovery/Iiving environment criteria description: Family is supportive  ASAM Severity Score: ASAM's Severity Rating Score: 3  ASAM Recommended Level of Treatment: ASAM Recommended Level of Treatment: Level II Intensive Outpatient Treatment   Substance use Disorder (SUD)    Recommendations for Services/Supports/Treatments: Recommendations for Services/Supports/Treatments Recommendations For Services/Supports/Treatments: Facility Based Crisis, Individual Therapy, CD-IOP Intensive Chemical Dependency Program, Medication  Management  Disposition Recommendation per psychiatric provider: We recommend transfer to [facility]. Admit to [facility].   DSM5 Diagnoses: Patient Active Problem List   Diagnosis Date Noted   Family history of prostate cancer in father 09/16/2021   Seasonal allergic rhinitis due to pollen 09/16/2021   Chews tobacco 08/13/2019   Chronic tonsillitis 10/26/2014   Acute mechanical low back pain with duration of less than six weeks 08/13/2014   ADHD (attention deficit hyperactivity disorder), combined type 06/12/2013     Referrals to Alternative Service(s): Referred to Alternative Service(s):   Place:   Date:   Time:    Referred to Alternative Service(s):   Place:   Date:   Time:    Referred to Alternative Service(s):   Place:   Date:   Time:    Referred to Alternative Service(s):   Place:   Date:   Time:     [place] LITTIE Louder, [facility]

## 2023-12-25 NOTE — ED Notes (Signed)
 Pt arrived to [facility] bed 153 direct admit from assessment.  Pt here for ETOH detox. Pt states that he would like IOP as discharge plan.   Denied current SI plan and intent.  Denied HI and A/V hallucinations.  Denied HX of DT or withdrawal seizures.   Pt is tremulous, anxious, slight sweating noted at present. Explained [facility] process and expectations.  Pt verbalized understanding.   Q 15 minute observations for safety continue  Pt did not want/need to get any numbers out of cell phone

## 2023-12-25 NOTE — Discharge Planning (Signed)
 LCSW met with patient to assess current mood, affect, physical state, and inquire about needs/goals while here in [facility] and after discharge. Patient reports he presented to [facility] for alcohol detox. Patient reports drinking a filth a day of alcohol with last drink on yesterday (Tuesday). Patient reports he has been living with his grandparents and has a lot of supports. Patient reports his current goal is to seek outpatient resources of IOP for substance use. We discussed [facility] for IOP and patient voiced agreement. Patient denies any prior history of outpatient treatment but did stay at [facility] in 2021 and was successful in cessation of cocaine and has not done since then. Patient currently denies any SI/HI/AVH. Patient expressed understanding and appreciation of LCSW assistance. No other needs were reported at this time by patient.

## 2023-12-25 NOTE — Group Note (Signed)
 Group Topic: Change and Accountability  Group Date: 12/25/2023 Start Time: 1030 End Time: 1130 Facilitators: Alyse Leilani LABOR, NT  Department: [facility]  Number of Participants: 5  Group Focus: chemical dependency education, chemical dependency issues, and clarity of thought Treatment Modality:  Leisure Development and Solution-Focused Therapy Interventions utilized were group exercise Purpose: regain self-worth and reinforce self-care  Name: Jacob Bright Date of Birth: 09-May-1991  MR: [id number]    Pt didn't attend group Patients Problems:  Patient Active Problem List   Diagnosis Date Noted   Alcohol use disorder, severe, dependence (HCC) 12/25/2023   Family history of prostate cancer in father 09/16/2021   Seasonal allergic rhinitis due to pollen 09/16/2021   Chews tobacco 08/13/2019   Chronic tonsillitis 10/26/2014   Acute mechanical low back pain with duration of less than six weeks 08/13/2014   ADHD (attention deficit hyperactivity disorder), combined type 06/12/2013

## 2023-12-25 NOTE — ED Notes (Signed)
 Pt is being monitored via CIWA protocol and is on an ATIVAN  taper for ETOH withdrawals Pt is noted to be less tremulous and he states he doesn't feel as bad as when he first arrived

## 2023-12-25 NOTE — Discharge Instructions (Addendum)
 [facility] Recovery Services   [address] [place], [place] [address] Mailing Address: [address], [place], [place] [address] Hours: Mon-Fri 8AM-5PM Phone: [phone] Fax: [phone] Services  Adult Services Advanced Access Walk-In Assessments - All Disabilities Routine Outpatient Therapy Psychiatry/Med Management Substance Abuse Intensive Outpatient (SAIOP) Mobile Crisis Assertive Community Treatment Team (ACTT) Jail Services Medication Assisted Treatment - MAT (Suboxone) Tailored Care Management (TCM)  Your appointment for IOP assessment is scheduled for Monday August 11, @ 10:00. Please be sure to bring your photo ID and insurance card.    AA meeting schedule brochure for [facility] will be attached to this discharge paperwork.

## 2023-12-25 NOTE — Group Note (Signed)
 Group Topic: Communication  Group Date: 12/25/2023 Start Time: 1000 End Time: 1030 Facilitators: Carletha Iha, RN  Department: [facility]  Number of Participants: 6  Group Focus: nursing group Treatment Modality:  Psychoeducation Interventions utilized were patient education Purpose: enhance coping skills  Name: Jacob Bright Date of Birth: Dec 16, 1990  MR: [id number]    Level of Participation: did not attend Quality of Participation:  Interactions with others:  Mood/Affect:  Triggers (if applicable):  Cognition:  Progress:  Response:  Plan:   Patients Problems:  Patient Active Problem List   Diagnosis Date Noted   Alcohol use disorder, severe, dependence (HCC) 12/25/2023   Family history of prostate cancer in father 09/16/2021   Seasonal allergic rhinitis due to pollen 09/16/2021   Chews tobacco 08/13/2019   Chronic tonsillitis 10/26/2014   Acute mechanical low back pain with duration of less than six weeks 08/13/2014   ADHD (attention deficit hyperactivity disorder), combined type 06/12/2013

## 2023-12-25 NOTE — Progress Notes (Signed)
   12/25/23 0740  [facility] Triage Screening (Walk-ins at [facility] only)  How Did You Hear About Us ? Self  What Is the Reason for Your Visit/Call Today? Patient is a 33 y.o. male with a history of polysubstance abuse who presents intoxicated.  Patient reports increased alcohol use since going through a divorce for the past month.  He reports he has been drinking 1/5 of liquor daily for the past month.  Patient states an additional stressor this past week was learning his wife left with their dog while he was on vacation with family.  Patient denies SI, HI and AVH. He is seeking detox treatment.  How Long Has This Been Causing You Problems? 1-6 months  Have You Recently Had Any Thoughts About Hurting Yourself? No  Are You Planning to Commit Suicide/Harm Yourself At This time? No  Have you Recently Had Thoughts About Hurting Someone Sherral? No  Are You Planning To Harm Someone At This Time? No  Physical Abuse Denies  Verbal Abuse Denies  Sexual Abuse Denies  Exploitation of patient/patient's resources Denies  Self-Neglect Denies  Possible abuse reported to:  (N/A)  Are you currently experiencing any auditory, visual or other hallucinations? No  Have You Used Any Alcohol or Drugs in the Past 24 Hours? Yes  What Did You Use and How Much? Patient drank a pint of liquor yesterday - last drink around 4pm  Do you have any current medical co-morbidities that require immediate attention? No  Clinician description of patient physical appearance/behavior: Patient presents in alcohol withdrawal, reporting s/s of withdrawal.  He is calm, cooperative, pleasant AAOx5  What Do You Feel Would Help You the Most Today? Alcohol or Drug Use Treatment;Treatment for Depression or other mood problem  If access to [facility] Urgent Care was not available, would you have sought care in the Emergency Department? Yes  Determination of Need Urgent (48 hours)  Options For Referral Facility-Based Crisis;Outpatient Therapy;Medication  Management;Chemical Dependency Intensive Outpatient Therapy (CDIOP)  Determination of Need filed? Yes

## 2023-12-25 NOTE — Progress Notes (Signed)
Pt transferred to [facility]. 

## 2023-12-26 ENCOUNTER — Encounter (HOSPITAL_COMMUNITY): Payer: Self-pay | Admitting: Family Medicine

## 2023-12-26 DIAGNOSIS — F10239 Alcohol dependence with withdrawal, unspecified: Secondary | ICD-10-CM | POA: Diagnosis not present

## 2023-12-26 DIAGNOSIS — F909 Attention-deficit hyperactivity disorder, unspecified type: Secondary | ICD-10-CM | POA: Diagnosis not present

## 2023-12-26 DIAGNOSIS — R200 Anesthesia of skin: Secondary | ICD-10-CM | POA: Diagnosis not present

## 2023-12-26 DIAGNOSIS — R202 Paresthesia of skin: Secondary | ICD-10-CM | POA: Diagnosis not present

## 2023-12-26 LAB — CBC WITH DIFFERENTIAL/PLATELET
Abs Immature Granulocytes: 0.02 K/uL (ref 0.00–0.07)
Basophils Absolute: 0.1 K/uL (ref 0.0–0.1)
Basophils Relative: 2 %
Eosinophils Absolute: 0.1 K/uL (ref 0.0–0.5)
Eosinophils Relative: 2 %
HCT: 47.6 % (ref 39.0–52.0)
Hemoglobin: 16.1 g/dL (ref 13.0–17.0)
Immature Granulocytes: 1 %
Lymphocytes Relative: 20 %
Lymphs Abs: 0.6 K/uL — ABNORMAL LOW (ref 0.7–4.0)
MCH: 31.1 pg (ref 26.0–34.0)
MCHC: 33.8 g/dL (ref 30.0–36.0)
MCV: 91.9 fL (ref 80.0–100.0)
Monocytes Absolute: 0.3 K/uL (ref 0.1–1.0)
Monocytes Relative: 10 %
Neutro Abs: 2.1 K/uL (ref 1.7–7.7)
Neutrophils Relative %: 65 %
Platelets: 220 K/uL (ref 150–400)
RBC: 5.18 MIL/uL (ref 4.22–5.81)
RDW: 12.7 % (ref 11.5–15.5)
WBC: 3.2 K/uL — ABNORMAL LOW (ref 4.0–10.5)
nRBC: 0 % (ref 0.0–0.2)

## 2023-12-26 LAB — COMPREHENSIVE METABOLIC PANEL WITH GFR
ALT: 156 U/L — ABNORMAL HIGH (ref 0–44)
AST: 151 U/L — ABNORMAL HIGH (ref 15–41)
Albumin: 4.5 g/dL (ref 3.5–5.0)
Alkaline Phosphatase: 62 U/L (ref 38–126)
Anion gap: 12 (ref 5–15)
BUN: 15 mg/dL (ref 6–20)
CO2: 24 mmol/L (ref 22–32)
Calcium: 10.1 mg/dL (ref 8.9–10.3)
Chloride: 98 mmol/L (ref 98–111)
Creatinine, Ser: 0.94 mg/dL (ref 0.61–1.24)
GFR, Estimated: >60 mL/min (ref >60)
Glucose, Bld: 186 mg/dL — ABNORMAL HIGH (ref 70–99)
Potassium: 4.1 mmol/L (ref 3.5–5.1)
Sodium: 134 mmol/L — ABNORMAL LOW (ref 135–145)
Total Bilirubin: 1.4 mg/dL — ABNORMAL HIGH (ref 0.0–1.2)
Total Protein: 7.5 g/dL (ref 6.5–8.1)

## 2023-12-26 LAB — LIPID PANEL
Cholesterol: 258 mg/dL — ABNORMAL HIGH (ref 0–200)
HDL: >135 mg/dL (ref >40)
Triglycerides: 48 mg/dL (ref <150)
VLDL: 10 mg/dL (ref 0–40)

## 2023-12-26 LAB — TSH: TSH: 2.679 u[IU]/mL (ref 0.350–4.500)

## 2023-12-26 LAB — VITAMIN B12: Vitamin B-12: 1427 pg/mL — ABNORMAL HIGH (ref 180–914)

## 2023-12-26 LAB — ETHANOL: Alcohol, Ethyl (B): <15 mg/dL (ref <15)

## 2023-12-26 NOTE — Group Note (Signed)
 Group Topic: Recovery Basics  Group Date: 12/26/2023 Start Time: 1415 End Time: 1515 Facilitators: Laneta Renea POUR, NT; Derycke, Monico RAMAN, NT  Department: [facility]  Number of Participants: 3  Group Focus: substance abuse education Treatment Modality:  Psychoeducation Interventions utilized were patient education Purpose: enhance coping skills, increase insight, and trigger / craving management  Name: Jacob Bright Date of Birth: 1990-12-26  MR: [id number]    Level of Participation: active Quality of Participation: attentive Interactions with others: gave feedback Mood/Affect: appropriate Triggers (if applicable): none Cognition: coherent/clear Progress: Gaining insight Response: AA Meeting  Plan: patient will be encouraged to attend other groups.   Patients Problems:  Patient Active Problem List   Diagnosis Date Noted   Alcohol use disorder, severe, dependence (HCC) 12/25/2023   Family history of prostate cancer in father 09/16/2021   Seasonal allergic rhinitis due to pollen 09/16/2021   Chews tobacco 08/13/2019   Chronic tonsillitis 10/26/2014   Acute mechanical low back pain with duration of less than six weeks 08/13/2014   ADHD (attention deficit hyperactivity disorder), combined type 06/12/2013

## 2023-12-26 NOTE — ED Provider Notes (Signed)
 Behavioral Health Progress Note  Date and Time: 12/26/2023 11:29 AM Name: Jacob Bright MRN:  [id number]  Subjective:  Patient reports he is feeling better today. He is feeling a little shaky and dehydrated drinking water. He reports his mood as okay. He slept pretty well last night and has good appetite today. He says that all of his family has psychiatric problems. I Lunden't know what they are but they all have them. He reports that he saw a psychiatrist after the treatment program [facility] 10 years ago. He says it was mostly for sleep issues. He has a therapist Prentice May he was seeing up until 2 months ago when he had a huge fight with his wife and she kicked him out of the house. He was mostly sober drinking socially here and there. After the fight he went to drinking and stopped seeing his therapist. He would like to see a psychiatrist and continue with his therapist after leaving the [facility] as well as either IOP or AA meetings. Father goes to AA meetings  Diagnosis:  Final diagnoses:  Alcohol use disorder, severe, dependence (HCC)  Alcohol withdrawal syndrome with complication (HCC)  Attention deficit hyperactivity disorder (ADHD), unspecified ADHD type    Total Time spent with patient: 15 minutes  Past Psychiatric History: He has a history of ADHD and treated with Adderall.  Alcohol dep in the past, Kratom Dep, Cocaine dep, He has a history of completing treatment at [facility] several years ago. He was sober with only occasional ETOH use until the past month when he started to drink 1/5 daily due to marital discord and pending divorce.  He has a family medicine provider that prescribes Adderall.  Took Kratom for several years and came off of Kratom with Suboxone prescribed by Odis Sous, PA     Is the patient at risk to self? No  Has the patient been a risk to self in the past 6 months? No .    Has the patient been a risk to self within the distant past? No   Is the patient a risk  to others? No   Has the patient been a risk to others in the past 6 months? No   Has the patient been a risk to others within the distant past? No    Past Medical History: Exercise induced Asthma, Tonsillectomy, Spine misalignment and back pain, Ankle surgery, Bilateral inferior turbinate reduction Family History: All family members have psychiatric issues he says. He does not know what they are. Father attends AA. Social History: Pt is married and in the midst of a divorce. He has no children. 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  He is a former Chemical engineer.      Sleep: Good  Appetite:  Good  Current Medications:  Current Facility-Administered Medications  Medication Dose Route Frequency Provider Last Rate Last Admin   acetaminophen  (TYLENOL ) tablet 650 mg  650 mg Oral Q6H PRN Arloa Suzen RAMAN, NP       alum & mag hydroxide-simeth (MAALOX/MYLANTA) 200-200-20 MG/5ML suspension 30 mL  30 mL Oral Q4H PRN Arloa Suzen RAMAN, NP       cloNIDine  (CATAPRES ) tablet 0.1 mg  0.1 mg Oral BID PRN Arloa Suzen RAMAN, NP   0.1 mg at 12/25/23 1014   haloperidol  (HALDOL ) tablet 5 mg  5 mg Oral TID PRN Arloa Suzen RAMAN, NP       And  diphenhydrAMINE  (BENADRYL ) capsule 50 mg  50 mg Oral TID PRN Arloa Suzen RAMAN, NP       haloperidol  lactate (HALDOL ) injection 5 mg  5 mg Intramuscular TID PRN Arloa Suzen RAMAN, NP       And   diphenhydrAMINE  (BENADRYL ) injection 50 mg  50 mg Intramuscular TID PRN Arloa Suzen RAMAN, NP       And   LORazepam  (ATIVAN ) injection 2 mg  2 mg Intramuscular TID PRN Arloa Suzen RAMAN, NP       haloperidol  lactate (HALDOL ) injection 10 mg  10 mg Intramuscular TID PRN Arloa Suzen RAMAN, NP       And   diphenhydrAMINE  (BENADRYL ) injection 50 mg  50 mg Intramuscular TID PRN Arloa Suzen RAMAN, NP       And   LORazepam  (ATIVAN ) injection 2 mg  2 mg Intramuscular TID PRN Arloa Suzen RAMAN, NP        hydrOXYzine  (ATARAX ) tablet 25 mg  25 mg Oral TID PRN Arloa Suzen RAMAN, NP       loperamide  (IMODIUM ) capsule 2-4 mg  2-4 mg Oral PRN Arloa Suzen RAMAN, NP       LORazepam  (ATIVAN ) tablet 1 mg  1 mg Oral Q6H PRN Arloa Suzen RAMAN, NP       LORazepam  (ATIVAN ) tablet 1 mg  1 mg Oral TID Arloa Suzen RAMAN, NP   1 mg at 12/26/23 1022   Followed by   NOREEN ON 12/27/2023] LORazepam  (ATIVAN ) tablet 1 mg  1 mg Oral BID Arloa Suzen RAMAN, NP       Followed by   NOREEN ON 12/28/2023] LORazepam  (ATIVAN ) tablet 1 mg  1 mg Oral Daily Arloa Suzen RAMAN, NP       magnesium  hydroxide (MILK OF MAGNESIA) suspension 30 mL  30 mL Oral Daily PRN Arloa Suzen RAMAN, NP       magnesium  hydroxide (MILK OF MAGNESIA) suspension 30 mL  30 mL Oral Daily PRN Arloa Suzen RAMAN, NP       multivitamin with minerals tablet 1 tablet  1 tablet Oral Daily Arloa Suzen RAMAN, NP   1 tablet at 12/26/23 1022   nicotine  (NICODERM CQ  - dosed in mg/24 hours) patch 21 mg  21 mg Transdermal Daily Arloa Suzen RAMAN, NP   21 mg at 12/26/23 1022   ondansetron  (ZOFRAN -ODT) disintegrating tablet 4 mg  4 mg Oral Q6H PRN Arloa Suzen RAMAN, NP       QUEtiapine  (SEROQUEL ) tablet 25 mg  25 mg Oral QHS Finnian Husted J, MD   25 mg at 12/25/23 2115   thiamine  (VITAMIN B1) tablet 100 mg  100 mg Oral Daily Arloa Suzen RAMAN, NP   100 mg at 12/26/23 1022   Current Outpatient Medications  Medication Sig Dispense Refill   amphetamine -dextroamphetamine  (ADDERALL XR) 20 MG 24 hr capsule Take 20 mg by mouth 2 (two) times daily.      Labs  Lab Results:  Admission on 12/25/2023, Discharged on 12/25/2023  Component Date Value Ref Range Status   POC Amphetamine  UR 12/25/2023 Positive (A)  NONE DETECTED (Cut Off Level 1000 ng/mL) Final   POC Secobarbital (BAR) 12/25/2023 None Detected  NONE DETECTED (Cut Off Level 300 ng/mL) Final   POC Buprenorphine (BUP) 12/25/2023 None Detected  NONE DETECTED (Cut Off Level 10 ng/mL) Final   POC Oxazepam  (BZO) 12/25/2023 Positive (A)  NONE DETECTED (Cut Off Level 300 ng/mL) Final   POC Cocaine UR 12/25/2023 None Detected  NONE  DETECTED (Cut Off Level 300 ng/mL) Final   POC Methamphetamine UR 12/25/2023 None Detected  NONE DETECTED (Cut Off Level 1000 ng/mL) Final   POC Morphine  12/25/2023 None Detected  NONE DETECTED (Cut Off Level 300 ng/mL) Final   POC Methadone UR 12/25/2023 None Detected  NONE DETECTED (Cut Off Level 300 ng/mL) Final   POC Oxycodone  UR 12/25/2023 None Detected  NONE DETECTED (Cut Off Level 100 ng/mL) Final   POC Marijuana UR 12/25/2023 None Detected  NONE DETECTED (Cut Off Level 50 ng/mL) Final    Blood Alcohol level:  No results found for: [facility]  Metabolic Disorder Labs: No results found for: HGBA1C, MPG No results found for: PROLACTIN Lab Results  Component Value Date   CHOL 192 09/16/2021   TRIG 172 (H) 09/16/2021   HDL 52 09/16/2021   CHOLHDL 3.7 09/16/2021   LDLCALC 110 (H) 09/16/2021    Therapeutic Lab Levels: No results found for: LITHIUM No results found for: VALPROATE No results found for: CBMZ  Physical Findings   PHQ2-9    Flowsheet Row Office Visit from 09/16/2021 in [facility] Video Visit from 08/23/2020 in [facility] Office Visit from 04/02/2017 in [facility]  PHQ-2 Total Score 0 0 0   Flowsheet Row ED from 12/25/2023 in [facility] Most recent reading at 12/25/2023 10:00 AM ED from 12/25/2023 in [facility] Most recent reading at 12/25/2023  8:51 AM UC from 08/17/2021 in [facility]  Most recent reading at 08/17/2021  8:50 AM  C-SSRS RISK CATEGORY No Risk No Risk No Risk     Musculoskeletal  Strength & Muscle Tone: within normal limits Gait & Station: normal Patient leans: N/A  Psychiatric Specialty Exam  Presentation  General Appearance:  Appropriate for Environment  Eye Contact: Fair  Speech: Clear and  Coherent  Speech Volume: Normal  Handedness: -- (did not assess)   Mood and Affect  Mood: Anxious; Depressed  Affect: Congruent   Thought Process  Thought Processes: Coherent  Descriptions of Associations:Intact  Orientation:Full (Time, Place and Person)  Thought Content:WDL  Diagnosis of Schizophrenia or Schizoaffective disorder in past: No    Hallucinations:Hallucinations: None  Ideas of Reference:None  Suicidal Thoughts:Suicidal Thoughts: No  Homicidal Thoughts:Homicidal Thoughts: No   Sensorium  Memory: Immediate Good; Recent Good; Remote Good  Judgment: Good  Insight: Good  Executive Functions  Concentration: good Attention Span: Good  Recall: Good  Fund of Knowledge: Good  Language: WNL   Psychomotor Activity  Psychomotor Activity: Psychomotor Activity: Normal   Assets  Assets: Communication Skills; Desire for Improvement; Resilience   Sleep  Sleep: Good  8 hrs  Nutritional Assessment (For OBS and FBC admissions only) Has the patient had a weight loss or gain of 10 pounds or more in the last 3 months?: No Has the patient had a decrease in food intake/or appetite?: Yes Does the patient have dental problems?: No Does the patient have eating habits or behaviors that may be indicators of an eating disorder including binging or inducing vomiting?: No Has the patient recently lost weight without trying?: 0 Has the patient been eating poorly because of a decreased appetite?: 0 Malnutrition Screening Tool Score: 0    Physical Exam  Physical Exam Constitutional:      Appearance: He is normal weight.  HENT:     Head: Normocephalic and atraumatic.     Nose: Nose normal.  Eyes:     Conjunctiva/sclera:  Conjunctivae normal.  Pulmonary:     Effort: Pulmonary effort is normal.  Neurological:     Mental Status: He is alert.    Review of Systems  Constitutional: Negative.  Negative for chills and fever.  Respiratory:   Negative for cough.   Cardiovascular: Negative.   Musculoskeletal: Negative.   Psychiatric/Behavioral:  The patient has insomnia.   He drinks himself to sleep typically. Blood pressure (!) 124/91, pulse 93, temperature 97.8 F (36.6 C), resp. rate 18, SpO2 97%. There is no height or weight on file to calculate BMI.  Treatment Plan Summary: Daily contact with patient to assess and evaluate symptoms and progress in treatment and Medication management 1. Alcohol use disorder, severe, dependence (HCC) (Primary) 2. Alcohol withdrawal syndrome without complication (HCC) -UDS positive for benzodiazepines(UDS was collected and resulted prior to patient receiving any Ativan  during this admission).  UDS positive for amphetamines, patient has an active prescription for Adderall which was filled on 12/05/2023, EKG shows normal sinus rhythm, heart rate 86, QTc 430 CIWA protocol with Ativan  taper prescribed for alcohol withdrawal symptoms -thiamine  replacement ordered given excessive alcohol use, Check B12 due to c/o numbness and parathesia  Labs pending: Labs not drawn yesterday will be drawn today TSH, due to worsening depression and rule out hypothyroidism Lipid, standard lab ordered for all patients received an antipsychotic therapy Ethanol, standard order for all patient reporting alcohol misuse or overuse Hemoglobin A1c, standard lab ordered for all patients received an antipsychotic therapy CBC, standing to rule out any infection, immunosuppression or anemia CMP, rule out metabolic conditions which may be contributing to mental health symptoms   Labs pending: TSH, due to worsening depression and rule out hypothyroidism Lipid, standard lab ordered for all patients received an antipsychotic therapy Ethanol, standard order for all patient reporting alcohol misuse or overuse Hemoglobin A1c, standard lab ordered for all patients received an antipsychotic therapy CBC, standing to rule out any  infection, immunosuppression or anemia CMP, rule out metabolic conditions which may be contributing to mental health symptoms   3. Elevated BP without diagnosis of hypertension  -Clonidine  0.1 twice daily as needed BP systolic 150 or greater, BP diastolic 90 or greater  Continue to monitor 124/91  4. Mood and sleep problems adjustment disorder with disturbance of mood Seroquel  25 mg at bedtime.  Dispo: IOP Psychiatrist and Therapist Fred May.    Garvin JINNY Gaines, MD 12/26/2023 11:29 AM

## 2023-12-26 NOTE — ED Notes (Signed)
 Pt is in the dayroom watching TV with peers. Pt denies SI/HI/AVH. Pt has no further complain.No acute distress noted.

## 2023-12-26 NOTE — Group Note (Signed)
 Group Topic: Recovery Basics  Group Date: 12/26/2023 Start Time: 1210 End Time: 1235 Facilitators: Stanly Stabile, RN  Department: [facility]  Number of Participants: 3  Group Focus: chemical dependency education, chemical dependency issues, clarity of thought, and coping skills Treatment Modality:  Behavior Modification Therapy Interventions utilized were clarification, exploration, group exercise, and patient education Purpose: express feelings, express irrational fears, improve communication skills, increase insight, regain self-worth, and reinforce self-care  Name: Jacob Bright Date of Birth: May 03, 1991  MR: [id number]    Level of Participation: moderate Quality of Participation: attentive and cooperative Interactions with others: gave feedback Mood/Affect: appropriate Triggers (if applicable):   Cognition: coherent/clear Progress: Gaining insight Response:   Plan: follow-up needed  Patients Problems:  Patient Active Problem List   Diagnosis Date Noted   Alcohol use disorder, severe, dependence (HCC) 12/25/2023   Family history of prostate cancer in father 09/16/2021   Seasonal allergic rhinitis due to pollen 09/16/2021   Chews tobacco 08/13/2019   Chronic tonsillitis 10/26/2014   Acute mechanical low back pain with duration of less than six weeks 08/13/2014   ADHD (attention deficit hyperactivity disorder), combined type 06/12/2013

## 2023-12-26 NOTE — Group Note (Signed)
 Group Topic: Recovery Basics  Group Date: 12/26/2023 Start Time: 2000 End Time: 2030 Facilitators: Verdon Jacqualyn BRAVO, NT  Department: [facility]  Number of Participants: 5  Group Focus: substance abuse education Treatment Modality:  Patient-Centered Therapy Interventions utilized were group exercise Purpose: relapse prevention strategies  Name: Jacob Bright Date of Birth: [birth year]  MR: [id number]    Level of Participation: active Quality of Participation: cooperative Interactions with others: gave feedback Mood/Affect: appropriate Triggers (if applicable): n/a Cognition: coherent/clear Progress: Moderate Response: n/a Plan: follow-up needed  Patients Problems:  Patient Active Problem List   Diagnosis Date Noted   Alcohol use disorder, severe, dependence (HCC) 12/25/2023   Family history of prostate cancer in father 09/16/2021   Seasonal allergic rhinitis due to pollen 09/16/2021   Chews tobacco 08/13/2019   Chronic tonsillitis 10/26/2014   Acute mechanical low back pain with duration of less than six weeks 08/13/2014   ADHD (attention deficit hyperactivity disorder), combined type 06/12/2013

## 2023-12-26 NOTE — ED Notes (Signed)
 Patient is asleep in bed without issue or complaint.  CIWA is 0 at this time.  No evidence of active withdrawal.  Will monitor.

## 2023-12-26 NOTE — ED Notes (Signed)
 Patient is sleeping. Respirations equal and unlabored, skin warm and dry, NAD. No change in assessment or acuity. Routine safety checks conducted according to facility protocol.

## 2023-12-26 NOTE — ED Notes (Signed)
 Patient is sleeping. Respirations equal and unlabored, skin warm and dry. No change in assessment or acuity. Routine safety checks conducted according to facility protocol.

## 2023-12-26 NOTE — ED Notes (Signed)
 Patient is sleeping. Respirations equal and unlabored. No change in assessment or acuity. Routine safety checks conducted according to facility protocol.

## 2023-12-27 DIAGNOSIS — R200 Anesthesia of skin: Secondary | ICD-10-CM | POA: Diagnosis not present

## 2023-12-27 DIAGNOSIS — R202 Paresthesia of skin: Secondary | ICD-10-CM | POA: Diagnosis not present

## 2023-12-27 DIAGNOSIS — F909 Attention-deficit hyperactivity disorder, unspecified type: Secondary | ICD-10-CM | POA: Diagnosis not present

## 2023-12-27 DIAGNOSIS — F10239 Alcohol dependence with withdrawal, unspecified: Secondary | ICD-10-CM | POA: Diagnosis not present

## 2023-12-27 LAB — HEMOGLOBIN A1C
Hgb A1c MFr Bld: 5.2 % (ref 4.8–5.6)
Mean Plasma Glucose: 103 mg/dL

## 2023-12-27 MED ORDER — QUETIAPINE FUMARATE 50 MG PO TABS
50.0000 mg | ORAL_TABLET | Freq: Every day | ORAL | Status: DC
  Administered 2023-12-27: 50 mg via ORAL
  Filled 2023-12-27: qty 1

## 2023-12-27 MED ORDER — VITAMIN B-1 100 MG PO TABS: 100.0000 mg | ORAL_TABLET | Freq: Every day | ORAL | 0 refills | Status: AC

## 2023-12-27 MED ORDER — QUETIAPINE FUMARATE 50 MG PO TABS: 50.0000 mg | ORAL_TABLET | Freq: Every day | ORAL | 0 refills | Status: DC

## 2023-12-27 MED ORDER — ADULT MULTIVITAMIN W/MINERALS CH: 1.0000 | ORAL_TABLET | Freq: Every day | ORAL | 0 refills | Status: AC

## 2023-12-27 NOTE — Group Note (Signed)
 Group Topic: Understanding Self  Group Date: 12/27/2023 Start Time: 1215 End Time: 1300 Facilitators: Stanly Stabile, RN  Department: [facility]  Number of Participants: 5  Group Focus: acceptance, affirmation, chemical dependency education, clarity of thought, concentration, and coping skills Treatment Modality:  Behavior Modification Therapy Interventions utilized were clarification, confrontation, exploration, group exercise, leisure development, and patient education Purpose: enhance coping skills, explore maladaptive thinking, express feelings, express irrational fears, improve communication skills, increase insight, regain self-worth, and reinforce self-care  Name: Jacob Bright Date of Birth: 08-Nov-1990  MR: [id number]    Level of Participation: active Quality of Participation: attentive and cooperative Interactions with others: gave feedback Mood/Affect: appropriate Triggers (if applicable):   Cognition: coherent/clear and goal directed Progress: Moderate Response:   Plan: follow-up needed  Patients Problems:  Patient Active Problem List   Diagnosis Date Noted   Alcohol use disorder, severe, dependence (HCC) 12/25/2023   Family history of prostate cancer in father 09/16/2021   Seasonal allergic rhinitis due to pollen 09/16/2021   Chews tobacco 08/13/2019   Chronic tonsillitis 10/26/2014   Acute mechanical low back pain with duration of less than six weeks 08/13/2014   ADHD (attention deficit hyperactivity disorder), combined type 06/12/2013

## 2023-12-27 NOTE — Group Note (Signed)
 Group Topic: Trust and Honesty  Group Date: 12/27/2023 Start Time: 0800 End Time: 0830 Facilitators: Lonzell Dwayne RAMAN, NT  Department: [facility]  Number of Participants: 3  Group Focus: chemical dependency issues Treatment Modality:  Behavior Modification Therapy Interventions utilized were patient education Purpose: increase insight  Name: Jacob Bright Date of Birth: August 10, 1990  MR: [id number]    Level of Participation: Patient refused to get up for group  Quality of Participation: N/A Interactions with others: N/A Mood/Affect: N/A Triggers (if applicable): N/A Cognition: N/A Progress: N/A Response: N/A Plan: N/A  Patients Problems:  Patient Active Problem List   Diagnosis Date Noted   Alcohol use disorder, severe, dependence (HCC) 12/25/2023   Family history of prostate cancer in father 09/16/2021   Seasonal allergic rhinitis due to pollen 09/16/2021   Chews tobacco 08/13/2019   Chronic tonsillitis 10/26/2014   Acute mechanical low back pain with duration of less than six weeks 08/13/2014   ADHD (attention deficit hyperactivity disorder), combined type 06/12/2013

## 2023-12-27 NOTE — ED Notes (Signed)
 Pt is sleeping, no acute distress noticed.

## 2023-12-27 NOTE — Group Note (Signed)
 Group Topic: Relapse and Recovery  Group Date: 12/27/2023 Start Time: 8069 End Time: 2000 Facilitators: Verdon Jacqualyn BRAVO, NT  Department: [facility]  Number of Participants: 5  Group Focus: relapse prevention Treatment Modality:  Individual Therapy Interventions utilized were group exercise Purpose: relapse prevention strategies  Name: Jacob Bright Date of Birth: [birth year]  MR: [id number]    Level of Participation: active Quality of Participation: cooperative Interactions with others: gave feedback Mood/Affect: appropriate Triggers (if applicable): n/a Cognition: coherent/clear Progress: Moderate Response: goal- say sober, reach out to aa, and take it one day at a time, and ask my therapist and family for help if need it Plan: follow-up needed  Patients Problems:  Patient Active Problem List   Diagnosis Date Noted   Alcohol use disorder, severe, dependence (HCC) 12/25/2023   Family history of prostate cancer in father 09/16/2021   Seasonal allergic rhinitis due to pollen 09/16/2021   Chews tobacco 08/13/2019   Chronic tonsillitis 10/26/2014   Acute mechanical low back pain with duration of less than six weeks 08/13/2014   ADHD (attention deficit hyperactivity disorder), combined type 06/12/2013

## 2023-12-27 NOTE — ED Provider Notes (Signed)
 Behavioral Health Progress Note  Date and Time: 12/27/23 12:01 PM Name: Jacob Bright MRN:  [id number]  Subjective:  Patient reports that his mood is up and down given the circumstances. He had trouble with sleep last night. He woke up and had a hard time going back to sleep. Appetite is better. Energy is a little bit more. Exercise is push ups and sit ups. He has been talking to his family. He says I have a good life.  Diagnosis:  Final diagnoses:  Alcohol withdrawal syndrome with complication (HCC)  Attention deficit hyperactivity disorder (ADHD), unspecified ADHD type  Alcohol abuse    Total Time spent with patient: 20 minutes  Past Psychiatric History: He has a history of ADHD and treated with Adderall.  Alcohol dep in the past, Kratom Dep, Cocaine dep, He has a history of completing treatment at [facility] several years ago. He was sober with only occasional ETOH use until the past month when he started to drink 1/5 daily due to marital discord and pending divorce.  He has a family medicine provider that prescribes Adderall.  Took Kratom for several years and came off of Kratom with Suboxone prescribed by Odis Sous, PA Past Medical History: Exercise induced Asthma, Tonsillectomy, Spine misalignment and back pain, Ankle surgery, Bilateral inferior turbinate reduction Family History: All family members have psychiatric issues he says. He does not know what they are. Father attends AA. Social History: Pt is married and in the midst of a divorce. He has no children. 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  He is a former Chemical engineer.      Sleep: Fair  Appetite:  Fair  Current Medications:  Current Facility-Administered Medications  Medication Dose Route Frequency Provider Last Rate Last Admin   acetaminophen  (TYLENOL ) tablet 650 mg  650 mg Oral Q6H PRN Arloa Suzen RAMAN, NP       alum & mag hydroxide-simeth  (MAALOX/MYLANTA) 200-200-20 MG/5ML suspension 30 mL  30 mL Oral Q4H PRN Arloa Suzen RAMAN, NP       cloNIDine  (CATAPRES ) tablet 0.1 mg  0.1 mg Oral BID PRN Arloa Suzen RAMAN, NP   0.1 mg at 12/25/23 1014   haloperidol  (HALDOL ) tablet 5 mg  5 mg Oral TID PRN Arloa Suzen RAMAN, NP       And   diphenhydrAMINE  (BENADRYL ) capsule 50 mg  50 mg Oral TID PRN Arloa Suzen RAMAN, NP       haloperidol  lactate (HALDOL ) injection 5 mg  5 mg Intramuscular TID PRN Arloa Suzen RAMAN, NP       And   diphenhydrAMINE  (BENADRYL ) injection 50 mg  50 mg Intramuscular TID PRN Arloa Suzen RAMAN, NP       And   LORazepam  (ATIVAN ) injection 2 mg  2 mg Intramuscular TID PRN Arloa Suzen RAMAN, NP       haloperidol  lactate (HALDOL ) injection 10 mg  10 mg Intramuscular TID PRN Arloa Suzen RAMAN, NP       And   diphenhydrAMINE  (BENADRYL ) injection 50 mg  50 mg Intramuscular TID PRN Arloa Suzen RAMAN, NP       And   LORazepam  (ATIVAN ) injection 2 mg  2 mg Intramuscular TID PRN Arloa Suzen RAMAN, NP       hydrOXYzine  (ATARAX ) tablet 25 mg  25 mg Oral TID PRN Arloa Suzen RAMAN, NP       loperamide  (IMODIUM ) capsule 2-4 mg  2-4 mg  Oral PRN Arloa Suzen RAMAN, NP       LORazepam  (ATIVAN ) tablet 1 mg  1 mg Oral Q6H PRN Arloa Suzen RAMAN, NP       LORazepam  (ATIVAN ) tablet 1 mg  1 mg Oral BID Arloa Suzen RAMAN, NP   1 mg at 12/27/23 9074   Followed by   NOREEN ON 12/28/2023] LORazepam  (ATIVAN ) tablet 1 mg  1 mg Oral Daily Harris, Kimberly S, NP       magnesium  hydroxide (MILK OF MAGNESIA) suspension 30 mL  30 mL Oral Daily PRN Arloa Suzen RAMAN, NP       magnesium  hydroxide (MILK OF MAGNESIA) suspension 30 mL  30 mL Oral Daily PRN Arloa Suzen RAMAN, NP       multivitamin with minerals tablet 1 tablet  1 tablet Oral Daily Arloa Suzen RAMAN, NP   1 tablet at 12/27/23 9074   nicotine  (NICODERM CQ  - dosed in mg/24 hours) patch 21 mg  21 mg Transdermal Daily Arloa Suzen RAMAN, NP   21 mg at 12/26/23 1022   ondansetron   (ZOFRAN -ODT) disintegrating tablet 4 mg  4 mg Oral Q6H PRN Arloa Suzen RAMAN, NP       QUEtiapine  (SEROQUEL ) tablet 50 mg  50 mg Oral QHS Vicky Mccanless J, MD       thiamine  (VITAMIN B1) tablet 100 mg  100 mg Oral Daily Arloa Suzen RAMAN, NP   100 mg at 12/27/23 9074   Current Outpatient Medications  Medication Sig Dispense Refill   amphetamine -dextroamphetamine  (ADDERALL XR) 20 MG 24 hr capsule Take 20 mg by mouth 2 (two) times daily.      Labs  Lab Results:  Admission on 12/25/2023  Component Date Value Ref Range Status   TSH 12/26/2023 2.679  0.350 - 4.500 uIU/mL Final   Comment: Performed by a 3rd Generation assay with a functional sensitivity of <=0.01 uIU/mL. Performed at [facility], [address], [place], [place] [address]    Alcohol, Ethyl (B) 12/26/2023 <15  <15 mg/dL Final   Comment: (NOTE) For medical purposes only. Performed at [facility], [address] [name], [place] [address]    WBC 12/26/2023 3.2 (L)  4.0 - 10.5 K/uL Final   RBC 12/26/2023 5.18  4.22 - 5.81 MIL/uL Final   Hemoglobin 12/26/2023 16.1  13.0 - 17.0 g/dL Final   HCT 91/93/7974 47.6  39.0 - 52.0 % Final   MCV 12/26/2023 91.9  80.0 - 100.0 fL Final   MCH 12/26/2023 31.1  26.0 - 34.0 pg Final   MCHC 12/26/2023 33.8  30.0 - 36.0 g/dL Final   RDW 91/93/7974 12.7  11.5 - 15.5 % Final   Platelets 12/26/2023 220  150 - 400 K/uL Final   nRBC 12/26/2023 0.0  0.0 - 0.2 % Final   Neutrophils Relative % 12/26/2023 65  % Final   Neutro Abs 12/26/2023 2.1  1.7 - 7.7 K/uL Final   Lymphocytes Relative 12/26/2023 20  % Final   Lymphs Abs 12/26/2023 0.6 (L)  0.7 - 4.0 K/uL Final   Monocytes Relative 12/26/2023 10  % Final   Monocytes Absolute 12/26/2023 0.3  0.1 - 1.0 K/uL Final   Eosinophils Relative 12/26/2023 2  % Final   Eosinophils Absolute 12/26/2023 0.1  0.0 - 0.5 K/uL Final   Basophils Relative 12/26/2023 2  % Final   Basophils Absolute 12/26/2023 0.1  0.0 - 0.1 K/uL Final    Immature Granulocytes 12/26/2023 1  % Final  Abs Immature Granulocytes 12/26/2023 0.02  0.00 - 0.07 K/uL Final   Performed at [facility], [address], [place], [place] [address]   Sodium 12/26/2023 134 (L)  135 - 145 mmol/L Final   Potassium 12/26/2023 4.1  3.5 - 5.1 mmol/L Final   Chloride 12/26/2023 98  98 - 111 mmol/L Final   CO2 12/26/2023 24  22 - 32 mmol/L Final   Glucose, Bld 12/26/2023 186 (H)  70 - 99 mg/dL Final   Glucose reference range applies only to samples taken after fasting for at least 8 hours.   BUN 12/26/2023 15  6 - 20 mg/dL Final   Creatinine, Ser 12/26/2023 0.94  0.61 - 1.24 mg/dL Final   Calcium 91/93/7974 10.1  8.9 - 10.3 mg/dL Final   Total Protein 91/93/7974 7.5  6.5 - 8.1 g/dL Final   Albumin 91/93/7974 4.5  3.5 - 5.0 g/dL Final   AST 91/93/7974 151 (H)  15 - 41 U/L Final   ALT 12/26/2023 156 (H)  0 - 44 U/L Final   Alkaline Phosphatase 12/26/2023 62  38 - 126 U/L Final   Total Bilirubin 12/26/2023 1.4 (H)  0.0 - 1.2 mg/dL Final   GFR, Estimated 12/26/2023 >60  >60 mL/min Final   Comment: (NOTE) Calculated using the CKD-EPI Creatinine Equation (2021)    Anion gap 12/26/2023 12  5 - 15 Final   Performed at [facility], [address], [place], [place] [address]   Hgb A1c MFr Bld 12/26/2023 5.2  4.8 - 5.6 % Final   Comment: (NOTE)         Prediabetes: 5.7 - 6.4         Diabetes: >6.4         Glycemic control for adults with diabetes: <7.0    Mean Plasma Glucose 12/26/2023 103  mg/dL Final   Comment: (NOTE) Performed At: [facility] [address] [place], [place] [id number] Jennette Shorter MD Ey:[id number]    Cholesterol 12/26/2023 258 (H)  0 - 200 mg/dL Final   Triglycerides 91/93/7974 48  <150 mg/dL Final   HDL 91/93/7974 >135  >40 mg/dL Final   Total CHOL/HDL Ratio 12/26/2023 NOT CALCULATED  RATIO Final   VLDL 12/26/2023 10  0 - 40 mg/dL Final   LDL Cholesterol 12/26/2023 NOT CALCULATED  0 - 99 mg/dL Final   Performed  at [facility], [address] [place], [place] [address]   Vitamin B-12 12/26/2023 1,427 (H)  180 - 914 pg/mL Final   Comment: (NOTE) This assay is not validated for testing neonatal or myeloproliferative syndrome specimens for Vitamin B12 levels. Performed at [facility], [address] [place], [place] [address]   Admission on 12/25/2023, Discharged on 12/25/2023  Component Date Value Ref Range Status   POC Amphetamine  UR 12/25/2023 Positive (A)  NONE DETECTED (Cut Off Level 1000 ng/mL) Final   POC Secobarbital (BAR) 12/25/2023 None Detected  NONE DETECTED (Cut Off Level 300 ng/mL) Final   POC Buprenorphine (BUP) 12/25/2023 None Detected  NONE DETECTED (Cut Off Level 10 ng/mL) Final   POC Oxazepam (BZO) 12/25/2023 Positive (A)  NONE DETECTED (Cut Off Level 300 ng/mL) Final   POC Cocaine UR 12/25/2023 None Detected  NONE DETECTED (Cut Off Level 300 ng/mL) Final   POC Methamphetamine UR 12/25/2023 None Detected  NONE DETECTED (Cut Off Level 1000 ng/mL) Final   POC Morphine  12/25/2023 None Detected  NONE DETECTED (Cut Off Level 300 ng/mL)  Final   POC Methadone UR 12/25/2023 None Detected  NONE DETECTED (Cut Off Level 300 ng/mL) Final   POC Oxycodone  UR 12/25/2023 None Detected  NONE DETECTED (Cut Off Level 100 ng/mL) Final   POC Marijuana UR 12/25/2023 None Detected  NONE DETECTED (Cut Off Level 50 ng/mL) Final    Blood Alcohol level:  Lab Results  Component Value Date   [facility] <15 12/26/2023    Metabolic Disorder Labs: Lab Results  Component Value Date   HGBA1C 5.2 12/26/2023   MPG 103 12/26/2023   No results found for: PROLACTIN Lab Results  Component Value Date   CHOL 258 (H) 12/26/2023   TRIG 48 12/26/2023   HDL >135 12/26/2023   CHOLHDL NOT CALCULATED 12/26/2023   VLDL 10 12/26/2023   LDLCALC NOT CALCULATED 12/26/2023   LDLCALC 110 (H) 09/16/2021    Therapeutic Lab Levels: No results found for: LITHIUM No results found for: VALPROATE No results  found for: CBMZ  Physical Findings   PHQ2-9    Flowsheet Row Office Visit from 09/16/2021 in [facility] Video Visit from 08/23/2020 in [facility] Office Visit from 04/02/2017 in [facility]  PHQ-2 Total Score 0 0 0   Flowsheet Row ED from 12/25/2023 in [facility] Most recent reading at 12/25/2023 10:00 AM ED from 12/25/2023 in [facility] Most recent reading at 12/25/2023  8:51 AM UC from 08/17/2021 in [facility]  Most recent reading at 08/17/2021  8:50 AM  C-SSRS RISK CATEGORY No Risk No Risk No Risk     Musculoskeletal  Strength & Muscle Tone: within normal limits Gait & Station: normal Patient leans: N/A  Psychiatric Specialty Exam  Presentation  General Appearance:  Appropriate for Environment  Eye Contact: Good  Speech: Normal Rate  Speech Volume: Normal  Handedness: -- (not determined)   Mood and Affect  Mood: -- (up and down)  Affect: Constricted   Thought Process  Thought Processes: Coherent; Goal Directed; Linear  Descriptions of Associations:Intact  Orientation:Full (Time, Place and Person)  Thought Content:Logical  Diagnosis of Schizophrenia or Schizoaffective disorder in past: No    Hallucinations:Hallucinations: None  Ideas of Reference:None  Suicidal Thoughts:Suicidal Thoughts: No  Homicidal Thoughts:Homicidal Thoughts: No   Sensorium  Memory: Immediate Good  Judgment: Good  Insight: Good   Executive Functions  Concentration: Good  Attention Span: Good  Recall: Good  Fund of Knowledge: Good  Language: Good   Psychomotor Activity  Psychomotor Activity: Psychomotor Activity: Normal   Assets  Assets: Communication Skills; Desire for Improvement; Housing; Physical Health; Social Support; Vocational/Educational   Sleep  Sleep: Sleep: Good  Estimated Sleeping Duration (Last 24 Hours):  13.75-15.50 hours  No data recorded  Physical Exam  Physical Exam Vitals reviewed.  HENT:     Head: Normocephalic and atraumatic.     Nose: Nose normal.  Eyes:     Pupils: Pupils are equal, round, and reactive to light.  Pulmonary:     Effort: Pulmonary effort is normal.  Musculoskeletal:        General: Normal range of motion.     Cervical back: Normal range of motion.  Skin:    General: Skin is warm and dry.  Neurological:     Mental Status: He is alert.  Psychiatric:        Mood and Affect: Mood normal.        Behavior: Behavior normal.  Thought Content: Thought content normal.        Judgment: Judgment normal.    Review of Systems  Constitutional:  Negative for chills and fever.  HENT: Negative.    Respiratory: Negative.    Musculoskeletal: Negative.   Psychiatric/Behavioral:  Negative for depression, hallucinations and suicidal ideas. The patient has insomnia.    Blood pressure 124/89, pulse 90, temperature 97.7 F (36.5 C), temperature source Oral, resp. rate 18, SpO2 96%. There is no height or weight on file to calculate BMI.  Treatment Plan Summary: Daily contact with patient to assess and evaluate symptoms and progress in treatment and Medication management 1. Alcohol use disorder, severe, dependence (HCC) (Primary) 2. Alcohol withdrawal syndrome without complication (HCC) -UDS positive for benzodiazepines(UDS was collected and resulted prior to patient receiving any Ativan  during this admission).  UDS positive for amphetamines, patient has an active prescription for Adderall which was filled on 12/05/2023, EKG shows normal sinus rhythm, heart rate 86, QTc 430 CIWA protocol with Ativan  taper prescribed for alcohol withdrawal symptoms -thiamine  replacement ordered given excessive alcohol use, Check B12 due to c/o numbness and parathesia   Lab results: TSH, due to worsening depression and rule out hypothyroidism wnl Lipid, standard lab ordered for all  patients received an antipsychotic therapy Total Chol 258  and LDL110 HDL >135 Hemoglobin A1c,5.2 CBC, WBC 3.2 L CMP- Glu 186, AST ALT elevated 151, 156   3. Elevated BP without diagnosis of hypertension  -Clonidine  0.1 twice daily as needed BP systolic 150 or greater, BP diastolic 90 or greater  Continue to monitor 124/89   4. Mood and sleep problems adjustment disorder with disturbance of mood Increase Seroquel  to 50 mg at bedtime    Dispo: IOP Psychiatrist and Therapist Prentice May. Daily contact with patient to assess and evaluate symptoms and progress in treatment and Medication management  Garvin JINNY Gaines, MD 12/27/2023 12:01 PM

## 2023-12-27 NOTE — ED Notes (Signed)
 Patient is in the bedroom atm calm and composed. NAD. Denies SI/HI/AVH. Environment secured per policy. Respirations even and unlabored. Will monitor for safety.

## 2023-12-27 NOTE — ED Notes (Signed)
 Patient has been awake andalert on unit without distress or complaint.  Patient tolerating ativan  taper and is without withdrawal.  He is scheduled for discharge home tomorrow with follow up at [facility] IOP on Monday.  Will monitor patient and provide support as needed.

## 2023-12-27 NOTE — ED Notes (Signed)
 Patient asleep in bed without issue or complaint.  Breathing even and unlabored.  No withdrawal at this time.  Will monitor.

## 2023-12-27 NOTE — Group Note (Signed)
 Group Topic: Relapse and Recovery  Group Date: 12/27/2023 Start Time: 0830 End Time: 0930 Facilitators: Stanly Stabile, RN  Department: [facility]  Number of Participants: 5  Group Focus: chemical dependency education, chemical dependency issues, clarity of thought, and coping skills Treatment Modality:  Behavior Modification Therapy Interventions utilized were patient education Purpose: enhance coping skills, explore maladaptive thinking, express feelings, express irrational fears, improve communication skills, increase insight, regain self-worth, reinforce self-care, relapse prevention strategies, and trigger / craving management  Name: Jacob Bright Date of Birth: [birth year]  MR: [id number]    Level of Participation: moderate Quality of Participation: attentive and cooperative Interactions with others: gave feedback Mood/Affect: appropriate Triggers (if applicable):   Cognition: coherent/clear and goal directed Progress: Gaining insight Response:   Plan: follow-up needed  Patients Problems:  Patient Active Problem List   Diagnosis Date Noted   Alcohol use disorder, severe, dependence (HCC) 12/25/2023   Family history of prostate cancer in father 09/16/2021   Seasonal allergic rhinitis due to pollen 09/16/2021   Chews tobacco 08/13/2019   Chronic tonsillitis 10/26/2014   Acute mechanical low back pain with duration of less than six weeks 08/13/2014   ADHD (attention deficit hyperactivity disorder), combined type 06/12/2013

## 2023-12-28 DIAGNOSIS — F10239 Alcohol dependence with withdrawal, unspecified: Secondary | ICD-10-CM | POA: Diagnosis not present

## 2023-12-28 DIAGNOSIS — R202 Paresthesia of skin: Secondary | ICD-10-CM | POA: Diagnosis not present

## 2023-12-28 DIAGNOSIS — R200 Anesthesia of skin: Secondary | ICD-10-CM | POA: Diagnosis not present

## 2023-12-28 DIAGNOSIS — F909 Attention-deficit hyperactivity disorder, unspecified type: Secondary | ICD-10-CM | POA: Diagnosis not present

## 2023-12-28 NOTE — ED Provider Notes (Signed)
 FBC/OBS ASAP Discharge Summary  Date and Time: 12/28/2023 7:01 PM  Name: Jacob Bright  MRN:  [id number]   Discharge Diagnoses:  Final diagnoses:  Alcohol withdrawal syndrome with complication [facility])  Attention deficit hyperactivity disorder (ADHD), unspecified ADHD type  Alcohol abuse    Subjective: Patient is dressed and ready for discharge today. He reports that last rehab 10 years ago he took Seroquel  for a period of time and then tapered off of it with the psychiatrist he had. He is going to go back to his therapist and to a Psychiatrist and to IOP if he does not have a job. He is still not sure if he has a job or not. He was not voicing willingness to take more time off work.  He reports he feels hopefull. His sleep was fantastic. His appetite he reports as it is back.    Stay Summary: Patient was admitted for alcohol detox. He tolerated an ativan  taper. He did not have any withdrawal symptoms at discharge. He participated and was cooperative with treatment. Seroquel  a medication he took in the past with a similar presentation was affective. He continues with Seroquel  52 and is aware of side-effects and monitoring needs with this medication.   Total Time spent with patient: 20 minutes  Past Psychiatric History: He has a history of ADHD and treated with Adderall.  Alcohol dep in the past, Kratom Dep, Cocaine dep, He has a history of completing treatment at [facility] several years ago. He was sober with only occasional ETOH use until the past month when he started to drink 1/5 daily due to marital discord and pending divorce.  He has a family medicine provider that prescribes Adderall.  Took Kratom for several years and came off of Kratom with Suboxone prescribed by Odis Sous, PA Past Medical History: Exercise induced Asthma, Tonsillectomy, Spine misalignment and back pain, Ankle surgery, Bilateral inferior turbinate reduction Family History: All family members have psychiatric issues he  says. He does not know what they are. Father attends AA. Social History: Pt is married and in the midst of a divorce. He has no children. 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  He is a former Chemical engineer.   Tobacco Cessation:  Prescription not provided because: patient did not want it.   Current Medications:  No current facility-administered medications for this encounter.   Current Outpatient Medications  Medication Sig Dispense Refill   amphetamine -dextroamphetamine  (ADDERALL XR) 20 MG 24 hr capsule Take 20 mg by mouth 2 (two) times daily.     Multiple Vitamin (MULTIVITAMIN WITH MINERALS) TABS tablet Take 1 tablet by mouth daily. 30 tablet 0   QUEtiapine  (SEROQUEL ) 50 MG tablet Take 1 tablet (50 mg total) by mouth at bedtime. 30 tablet 0   thiamine  (VITAMIN B-1) 100 MG tablet Take 1 tablet (100 mg total) by mouth daily. 30 tablet 0    PTA Medications:  PTA Medications  Medication Sig   amphetamine -dextroamphetamine  (ADDERALL XR) 20 MG 24 hr capsule Take 20 mg by mouth 2 (two) times daily.   QUEtiapine  (SEROQUEL ) 50 MG tablet Take 1 tablet (50 mg total) by mouth at bedtime.   Multiple Vitamin (MULTIVITAMIN WITH MINERALS) TABS tablet Take 1 tablet by mouth daily.   thiamine  (VITAMIN B-1) 100 MG tablet Take 1 tablet (100 mg total) by mouth daily.   Facility Ordered Medications  Medication   [COMPLETED] ondansetron  (ZOFRAN -ODT) disintegrating tablet 8 mg   [  EXPIRED] loperamide  (IMODIUM ) capsule 2-4 mg   [EXPIRED] LORazepam  (ATIVAN ) tablet 1 mg   [COMPLETED] LORazepam  (ATIVAN ) tablet 1 mg   Followed by   [COMPLETED] LORazepam  (ATIVAN ) tablet 1 mg   Followed by   [COMPLETED] LORazepam  (ATIVAN ) tablet 1 mg   Followed by   [COMPLETED] LORazepam  (ATIVAN ) tablet 1 mg   [EXPIRED] ondansetron  (ZOFRAN -ODT) disintegrating tablet 4 mg   [COMPLETED] thiamine  (VITAMIN B1) injection 100 mg       12/28/2023   12:00 PM  09/16/2021    8:08 AM 08/23/2020   12:07 PM  Depression screen PHQ 2/9  Decreased Interest 2 0 0  Down, Depressed, Hopeless 1 0 0  PHQ - 2 Score 3 0 0  Altered sleeping 3    Tired, decreased energy 2    Change in appetite 2    Feeling bad or failure about yourself  2    Trouble concentrating 1    Moving slowly or fidgety/restless 0    Suicidal thoughts 0    PHQ-9 Score 13    Difficult doing work/chores Somewhat difficult      Flowsheet Row ED from 12/25/2023 in [facility] Most recent reading at 12/25/2023 10:00 AM ED from 12/25/2023 in [facility] Most recent reading at 12/25/2023  8:51 AM UC from 08/17/2021 in [facility]  Most recent reading at 08/17/2021  8:50 AM  C-SSRS RISK CATEGORY No Risk No Risk No Risk    Musculoskeletal  Strength & Muscle Tone: within normal limits Gait & Station: normal Patient leans: N/A  Psychiatric Specialty Exam  Presentation  General Appearance:  Appropriate for Environment; Well Groomed; Neat; Casual  Eye Contact: Good  Speech: Normal Rate; Clear and Coherent  Speech Volume: Normal  Handedness: Right   Mood and Affect  Mood: Euthymic  Affect: Appropriate   Thought Process  Thought Processes: Coherent  Descriptions of Associations:Intact  Orientation:Full (Time, Place and Person)  Thought Content:Logical  Diagnosis of Schizophrenia or Schizoaffective disorder in past: No    Hallucinations:Hallucinations: None  Ideas of Reference:None  Suicidal Thoughts:Suicidal Thoughts: No  Homicidal Thoughts:Homicidal Thoughts: No   Sensorium  Memory: Immediate Good; Recent Good; Remote Good  Judgment: Good  Insight: Good   Executive Functions  Concentration: Good  Attention Span: Good  Recall: Good  Fund of Knowledge: Good  Language: Good   Psychomotor Activity  Psychomotor Activity: Psychomotor Activity: Normal   Assets   Assets: Communication Skills; Desire for Improvement; Financial Resources/Insurance; Housing; Leisure Time; Physical Health; Social Support; Talents/Skills; Vocational/Educational; Resilience   Sleep  Sleep: Sleep: Good  Estimated Sleeping Duration (Last 24 Hours): 10.50-12.25 hours  No data recorded  Physical Exam  Physical Exam Constitutional:      General: He is not in acute distress.    Appearance: Normal appearance. He is normal weight.  HENT:     Head: Normocephalic and atraumatic.     Right Ear: External ear normal.     Left Ear: External ear normal.     Nose: Nose normal.  Eyes:     Pupils: Pupils are equal, round, and reactive to light.  Pulmonary:     Effort: Pulmonary effort is normal.  Musculoskeletal:        General: Normal range of motion.     Cervical back: Normal range of motion.  Skin:    General: Skin is warm and dry.  Neurological:     General: No focal deficit present.  Mental Status: He is alert.    Review of Systems  Constitutional:  Negative for chills, diaphoresis, fever, malaise/fatigue and weight loss.  HENT:  Negative for hearing loss.   Eyes:  Negative for blurred vision.  Respiratory:  Negative for cough.   Cardiovascular:  Negative for chest pain.  Gastrointestinal:  Negative for heartburn, nausea and vomiting.  Skin:  Negative for itching and rash.  Neurological:  Negative for dizziness and headaches.  Psychiatric/Behavioral:  Negative for depression, hallucinations and suicidal ideas. The patient is not nervous/anxious and does not have insomnia.    Blood pressure 115/79, pulse 88, temperature 97.9 F (36.6 C), temperature source Oral, resp. rate 17, SpO2 97%. There is no height or weight on file to calculate BMI.  Demographic Factors:  Male  Loss Factors: Loss of significant relationship  Historical Factors: NA  Risk Reduction Factors:   Living with another person, especially a relative  Continued Clinical Symptoms:   Alcohol/Substance Abuse/Dependencies  Cognitive Features That Contribute To Risk:  None    Suicide Risk:  Mild:  Suicidal ideation of limited frequency, intensity, duration, and specificity.  There are no identifiable plans, no associated intent, mild dysphoria and related symptoms, good self-control (both objective and subjective assessment), few other risk factors, and identifiable protective factors, including available and accessible social support.  Plan Of Care/Follow-up recommendations:  Activity:  Regular exercise.   Disposition:  Dispo: [facility] Fred [facility].   Garvin JINNY Gaines, MD 12/28/2023, 7:01 PM

## 2023-12-28 NOTE — ED Notes (Signed)
 Patient resting with eyes closed in no apparent acute distress. Respirations even and unlabored. Environment secured. Safety checks in place according to facility policy.

## 2023-12-28 NOTE — ED Notes (Signed)
 Patient asleep in the bedroom. NAD

## 2023-12-28 NOTE — Group Note (Signed)
 Group Topic: Relapse and Recovery  Group Date: 12/28/2023 Start Time: 1145 End Time: 1151 Facilitators: Chinaza Rooke, Zane HERO, RN  Department: [facility]  Number of Participants: 1  Group Focus: discharge education Treatment Modality:  Individual Therapy Interventions utilized were patient education Purpose: increase insight  Name: Jacob Bright Date of Birth: June 17, 1990  MR: [id number]    Level of Participation: active Quality of Participation: attentive, cooperative, and engaged Interactions with others: gave feedback Mood/Affect: appropriate and positive Triggers (if applicable): None identified at this time Cognition: coherent/clear, insightful, and logical Progress: Significant Response: Patient voiced understanding of information reviewed (AVS, prescriptions, follow up appointments). Facility contact information reviewed in case future questions arise. Plan: patient will be encouraged to attend scheduled follow up appointments, refer to suicide safety plan if needed, reach out for emergency support if needed, contact facility if future questions arise  Patients Problems:  Patient Active Problem List   Diagnosis Date Noted   Alcohol use disorder, severe, dependence (HCC) 12/25/2023   Family history of prostate cancer in father 09/16/2021   Seasonal allergic rhinitis due to pollen 09/16/2021   Chews tobacco 08/13/2019   Chronic tonsillitis 10/26/2014   Acute mechanical low back pain with duration of less than six weeks 08/13/2014   ADHD (attention deficit hyperactivity disorder), combined type 06/12/2013

## 2023-12-28 NOTE — ED Notes (Signed)
 Patient discharged with IOP per MD order. After Visit Summary (AVS) printed and given to patient, as well as printed prescriptions. AVS reviewed with patient and all questions fully answered. Patient discharged in no acute distress, A& O x4 and ambulatory. Patient denied SI/HI, A/VH upon discharge. Patient verbalized understanding of all discharge instructions explained by staff, including follow up appointments, RX's and safety plan. Patient mood fair. Patient belongings returned to patient from locker #5 complete and intact. Patient escorted to lobby via staff for transport to destination. Safety maintained.

## 2023-12-28 NOTE — ED Notes (Signed)
 Patient alert & oriented x4. Denies intent to harm self or others when asked. Denies A/VH. Patient denies any physical complaints when asked. No acute distress noted. Scheduled medications administered with no complications. Patient planned to discharge today around 1200 to home with IOP. Patient voices no concerns or questions regarding plan at this time. Support and encouragement provided. Patient observed in milieu. No inappropriate behaviors observed or reported. Routine safety checks conducted per facility protocol. Encouraged patient to notify staff if any thoughts of harm towards self or others arise. Patient verbalizes understanding and agreement.

## 2024-01-03 ENCOUNTER — Encounter: Payer: Self-pay | Admitting: Adult Health

## 2024-01-03 ENCOUNTER — Telehealth (INDEPENDENT_AMBULATORY_CARE_PROVIDER_SITE_OTHER): Admitting: Adult Health

## 2024-01-03 VITALS — Ht 74.0 in | Wt 180.0 lb

## 2024-01-03 DIAGNOSIS — F902 Attention-deficit hyperactivity disorder, combined type: Secondary | ICD-10-CM

## 2024-01-03 MED ORDER — QUETIAPINE FUMARATE 100 MG PO TABS: 100.0000 mg | ORAL_TABLET | Freq: Every day | ORAL | 0 refills | Status: DC

## 2024-01-03 MED ORDER — AMPHETAMINE-DEXTROAMPHET ER 20 MG PO CP24: 20.0000 mg | ORAL_CAPSULE | Freq: Two times a day (BID) | ORAL | 0 refills | Status: DC

## 2024-01-03 NOTE — Progress Notes (Signed)
 Virtual Visit via Video Note  I connected with Jacob Bright @ on 01/03/24 at  11:00 AM EDT by a video enabled telemedicine application and verified that I am speaking with the correct person using two identifiers.   I discussed the limitations of evaluation and management by telemedicine and the availability of in person appointments. The patient expressed understanding and agreed to proceed.  I discussed the assessment and treatment plan with the patient. The patient was provided an opportunity to ask questions and all were answered. The patient agreed with the plan and demonstrated an understanding of the instructions.   The patient was advised to call back or seek an in-person evaluation if the symptoms worsen or if the condition fails to improve as anticipated.  I provided 40 minutes of non-face-to-face time during this encounter.  The patient was located at home.  The provider was located at [facility].   Jacob [name], NP    [facility] MD/PA/NP Initial Note  01/03/2024 11:57 AM [name]  MRN:  [id number]  Chief Complaint:   HPI:   Patient seen today for initial psychiatric evaluation.   Referred by PCP for continuation of ADD medications.  Available collateral reviewed.  Describes mood today as ok. Pleasant.Mood symptoms - denies depression, but feels sad. Reports recent separation from wife of 2 years  - it has been difficult for me. Reports separating 3 months ago - wife now living in [place].Reports anxiety at times. Reports one recent panic attack. Denies irritability. Reports decreased interest and motivation. Denies worry, rumination and over thinking. Denies obsessive thoughts or acts. Reports mood as stable - more so over the past week. Reports taking Adderall XR 20mg  twice daily and Seroquel  50mg  at bedtime. Taking medications as prescribed. Working with therapist Prentice May. Energy levels stable. Active, does not have a regular exercise routine.  Enjoys  some usual interests and activities. Married, but separated. Living between parents and grandparents. Spending time with family and friends. Appetite adequate. Weight stable - 180 pounds. Reports sleeping better some nights than others. Averages 4 to 5 hours of broken sleep. Reports focus and concentration stable. Completing tasks. Managing aspects of household. Working full time IT consultant. Denies SI or HI.  Denies AH or VH. Denies self harm. Denies substance use.  Previous medication trials:   Adderall  Visit Diagnosis:    ICD-10-CM   1. ADHD (attention deficit hyperactivity disorder), combined type  F90.2       Past Psychiatric History: Denies psychiatric hospitalization.   Past Medical History:  Past Medical History:  Diagnosis Date   Chronic tonsillitis 09/2014   Exercise-induced asthma    no current med.   Spine misalignment    due to history of fx. hip as a teenager    Past Surgical History:  Procedure Laterality Date   ANKLE ARTHROSCOPY WITH RECONSTRUCTION Right 03/14/2013   Procedure: ANKLE ARTHROSCOPY WITH RECONSTRUCTION;  Surgeon: Jerona LULLA Sage, MD;  Location: [facility] OR;  Service: Orthopedics;  Laterality: Right;  Right Ankle Gould Modified Brostrom Reconstruction with Ankle Arthroscopy   NASAL SEPTOPLASTY W/ TURBINOPLASTY  05/10/2010   bilateral inferior turbinate reduction   TONSILLECTOMY N/A 10/26/2014   Procedure: TONSILLECTOMY;  Surgeon: Vaughan Ricker, MD;  Location: [facility];  Service: ENT;  Laterality: N/A;   TYMPANOSTOMY TUBE PLACEMENT      Family Psychiatric History: Reports family history of mental illness.   Family History:  Family History  Problem Relation Age of Onset   Hypertension  Father     Social History:  Social History   Socioeconomic History   Marital status: Single    Spouse name: n/a   Number of children: 0   Years of education: Not on file   Highest education level: Not on file  Occupational History    Occupation: Dentist: STUDENT    Comment: [employer]; Accounting  Tobacco Use   Smoking status: Never   Smokeless tobacco: Current    Types: Chew   Tobacco comments:    he has cut back  Vaping Use   Vaping status: Never Used  Substance and Sexual Activity   Alcohol use: Yes    Comment: Occ.   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   Social Drivers of Health   Financial Resource Strain: Medium Risk (08/16/2023)   Received from [facility]   Overall Financial Resource Strain (CARDIA)    Difficulty of Paying Living Expenses: Somewhat hard  Food Insecurity: No Food Insecurity (12/25/2023)   Hunger Vital Sign    Worried About Running Out of Food in the Last Year: Never true    Ran Out of Food in the Last Year: Never true  Transportation Needs: No Transportation Needs (12/25/2023)   PRAPARE - Administrator, Civil Service (Medical): No    Lack of Transportation (Non-Medical): No  Physical Activity: Sufficiently Active (08/16/2023)   Received from [facility]   Exercise Vital Sign    On average, how many days per week do you engage in moderate to strenuous exercise (like a brisk walk)?: 4 days    On average, how many minutes do you engage in exercise at this level?: 60 min  Stress: Stress Concern Present (08/16/2023)   Received from [facility] - Occupational Stress Questionnaire    Feeling of Stress : To some extent  Social Connections: Moderately Integrated (08/16/2023)   Received from [facility]   Social Network    How would you rate your social network (family, work, friends)?: Adequate participation with social networks    Allergies: No Known Allergies  Metabolic Disorder Labs: Lab Results  Component Value Date   HGBA1C 5.2 12/26/2023   MPG 103 12/26/2023   No  results found for: PROLACTIN Lab Results  Component Value Date   CHOL 258 (H) 12/26/2023   TRIG 48 12/26/2023   HDL >135 12/26/2023   CHOLHDL NOT CALCULATED 12/26/2023   VLDL 10 12/26/2023   LDLCALC NOT CALCULATED 12/26/2023   LDLCALC 110 (H) 09/16/2021   Lab Results  Component Value Date   TSH 2.679 12/26/2023    Therapeutic Level Labs: No results found for: LITHIUM No results found for: VALPROATE No results found for: CBMZ  Current Medications: Current Outpatient Medications  Medication Sig Dispense Refill   amphetamine -dextroamphetamine  (ADDERALL XR) 20 MG 24 hr capsule Take 1 capsule (20 mg total) by mouth 2 (two) times daily. 60 capsule 0   Multiple Vitamin (MULTIVITAMIN WITH MINERALS) TABS tablet Take 1 tablet by mouth daily. 30 tablet 0   QUEtiapine  (SEROQUEL ) 100 MG tablet Take 1 tablet (100 mg total) by mouth at bedtime. 30 tablet 0   thiamine  (VITAMIN B-1) 100 MG tablet Take 1 tablet (100 mg total) by mouth daily. 30 tablet 0   No current facility-administered  medications for this visit.    Medication Side Effects: none  Orders placed this visit:  No orders of the defined types were placed in this encounter.   Psychiatric Specialty Exam:  Review of Systems  Musculoskeletal:  Negative for gait problem.  Neurological:  Negative for tremors.  Psychiatric/Behavioral:         Please refer to HPI    Height 6' 2 (1.88 m), weight 180 lb (81.6 kg).Body mass index is 23.11 kg/m.  General Appearance: Casual and Neat  Eye Contact:  Good  Speech:  Clear and Coherent and Normal Rate  Volume:  Normal  Mood:  Euthymic  Affect:  Appropriate and Congruent  Thought Process:  Coherent and Descriptions of Associations: Intact  Orientation:  Full (Time, Place, and Person)  Thought Content: Logical   Suicidal Thoughts:  No  Homicidal Thoughts:  No  Memory:  WNL  Judgement:  Good  Insight:  Good  Psychomotor Activity:  Normal  Concentration:  Concentration:  Good and Attention Span: Good  Recall:  Good  Fund of Knowledge: Good  Language: Good  Assets:  Communication Skills Desire for Improvement Financial Resources/Insurance Housing Intimacy Leisure Time Physical Health Resilience Social Support Talents/Skills Transportation Vocational/Educational  ADL's:  Intact  Cognition: WNL  Prognosis:  Good   Screenings:  PHQ2-9    Flowsheet Row ED from 12/25/2023 in [facility] Office Visit from 09/16/2021 in [facility] Video Visit from 08/23/2020 in [facility] Office Visit from 04/02/2017 in [facility]  PHQ-2 Total Score 3 0 0 0  PHQ-9 Total Score 13 -- -- --   Flowsheet Row ED from 12/25/2023 in [facility] Most recent reading at 12/25/2023 10:00 AM ED from 12/25/2023 in [facility] Most recent reading at 12/25/2023  8:51 AM UC from 08/17/2021 in [facility] at [facility]  Most recent reading at 08/17/2021  8:50 AM  C-SSRS RISK CATEGORY No Risk No Risk No Risk    Receiving Psychotherapy: No   Treatment Plan/Recommendations:   Plan:  PDMP reviewed  Will continue Adderall XR 20mg  at twice - discussed previous substance use and advised patient ADD medication will be prescribed as long as there are no other substance use issues - patient agreed.  Increase Seroquel  50mg  to 100mg  at bedtime for sleep/mood  RTC 4 weeks  40 minutes spent dedicated to the care of this patient on the date of this encounter to include pre-visit review of records, ordering of medication, post visit documentation, and face-to-face time with the patient discussing ADD. Discussed continuing current medication regimen.  Patient advised to contact office with any questions, adverse effects, or acute worsening in signs and symptoms.    Ellyn Rubiano N Dakiyah Heinke, NP

## 2024-01-08 ENCOUNTER — Other Ambulatory Visit: Payer: Self-pay

## 2024-01-08 ENCOUNTER — Telehealth: Payer: Self-pay | Admitting: Adult Health

## 2024-01-08 DIAGNOSIS — F902 Attention-deficit hyperactivity disorder, combined type: Secondary | ICD-10-CM

## 2024-01-08 MED ORDER — AMPHETAMINE-DEXTROAMPHET ER 20 MG PO CP24: 20.0000 mg | ORAL_CAPSULE | Freq: Two times a day (BID) | ORAL | 0 refills | Status: DC

## 2024-01-08 NOTE — Telephone Encounter (Signed)
 Canceled at [facility]

## 2024-01-08 NOTE — Telephone Encounter (Signed)
 Pended to [facility], need to cancel at [place].

## 2024-01-08 NOTE — Telephone Encounter (Signed)
 Pt called and said that the [facility] on [address] doesn't have the adderall xr 20 mg in stock. Please cancel and send new script to the [facility] on [address]. They do have it in stock.

## 2024-01-18 ENCOUNTER — Telehealth: Payer: Self-pay | Admitting: Adult Health

## 2024-01-18 MED ORDER — QUETIAPINE FUMARATE 200 MG PO TABS: 200.0000 mg | ORAL_TABLET | Freq: Every day | ORAL | 0 refills | Status: DC

## 2024-01-18 NOTE — Telephone Encounter (Signed)
 Pt called asking for a refill on on his quetiapine  200 mg. He said that helps him sleep the best. So he needs a new script sent in to the [facility] [address]. Next appt 9/11

## 2024-01-18 NOTE — Telephone Encounter (Signed)
 Sent enough 200 mg to get to appt and pt notified.

## 2024-01-18 NOTE — Telephone Encounter (Signed)
 Pt seen for the first time on 8/14. At that time Seroquel  was increased from 50 mg to 100 mg. He has self-increased to 200 mg and reports it is doing well. He is asking for a new Rx. Is it ok for him to increase dose? He has FU 9/11.

## 2024-01-31 ENCOUNTER — Telehealth: Admitting: Adult Health

## 2024-01-31 ENCOUNTER — Encounter: Payer: Self-pay | Admitting: Adult Health

## 2024-01-31 DIAGNOSIS — F902 Attention-deficit hyperactivity disorder, combined type: Secondary | ICD-10-CM

## 2024-01-31 DIAGNOSIS — F5105 Insomnia due to other mental disorder: Secondary | ICD-10-CM

## 2024-01-31 DIAGNOSIS — F909 Attention-deficit hyperactivity disorder, unspecified type: Secondary | ICD-10-CM | POA: Diagnosis not present

## 2024-01-31 MED ORDER — AMPHETAMINE-DEXTROAMPHET ER 20 MG PO CP24: 20.0000 mg | ORAL_CAPSULE | Freq: Every day | ORAL | 0 refills | Status: DC

## 2024-01-31 MED ORDER — QUETIAPINE FUMARATE 200 MG PO TABS
200.0000 mg | ORAL_TABLET | Freq: Every day | ORAL | 2 refills | Status: DC
Start: 2024-01-31 — End: 2024-04-14

## 2024-01-31 MED ORDER — AMPHETAMINE-DEXTROAMPHET ER 20 MG PO CP24: 20.0000 mg | ORAL_CAPSULE | Freq: Two times a day (BID) | ORAL | 0 refills | Status: DC

## 2024-01-31 NOTE — Progress Notes (Addendum)
 Jacob Bright [id number] July 16, 1990 33 y.o.  Virtual Visit via Video Note  I connected with pt @ on 01/31/24 at  9:00 AM EDT by a video enabled telemedicine application and verified that I am speaking with the correct person using two identifiers.   I discussed the limitations of evaluation and management by telemedicine and the availability of in person appointments. The patient expressed understanding and agreed to proceed.  I discussed the assessment and treatment plan with the patient. The patient was provided an opportunity to ask questions and all were answered. The patient agreed with the plan and demonstrated an understanding of the instructions.   The patient was advised to call back or seek an in-person evaluation if the symptoms worsen or if the condition fails to improve as anticipated.  I provided 10 minutes of non-face-to-face time during this encounter.  The patient was located at home.  The provider was located at [facility].   Jacob Bright Jacob Sayers, NP   Subjective:   Patient ID:  Jacob Bright is a 33 y.o. (DOB [birth year]) male.  Chief Complaint: No chief complaint on file.   HPI Jacob Bright presents for follow-up of ADHD.  Referred by PCP for continuation of ADD medications.  Describes mood today as ok. Pleasant.Mood symptoms - denies depression, not as sad. Denies anxiety. Denies panic attack. Denies irritability. Reports increased interest and motivation. Denies worry, rumination and over thinking. Denies obsessive thoughts or acts. Reports mood as stable. Stating I feel like I'm doing better. Reports taking Adderall XR 20mg  twice daily and Seroquel  200mg  at bedtime. Taking medications as prescribed. Working with therapist Prentice May. Energy levels stable. Active, has a regular exercise routine.  Enjoys some usual interests and activities. Married, but separated. Living between parents and grandparents. Spending time with family and friends. Appetite  adequate. Weight stable - 180 pounds. Reports sleeping better some nights than others. Averages 6 to 8 hours of broken sleep. Reports focus and concentration stable. Completing tasks. Managing aspects of household. Working full time IT consultant. Denies SI or HI.  Denies AH or VH. Denies self harm. Denies substance use.  Previous medication trials:   Adderall   Review of Systems:  Review of Systems  Musculoskeletal:  Negative for gait problem.  Neurological:  Negative for tremors.  Psychiatric/Behavioral:         Please refer to HPI    Medications: I have reviewed the patient's current medications.  Current Outpatient Medications  Medication Sig Dispense Refill   amphetamine -dextroamphetamine  (ADDERALL XR) 20 MG 24 hr capsule Take 1 capsule (20 mg total) by mouth 2 (two) times daily. 60 capsule 0   QUEtiapine  (SEROQUEL ) 100 MG tablet Take 1 tablet (100 mg total) by mouth at bedtime. 30 tablet 0   QUEtiapine  (SEROQUEL ) 200 MG tablet Take 1 tablet (200 mg total) by mouth at bedtime. 14 tablet 0   No current facility-administered medications for this visit.    Medication Side Effects: None  Allergies: No Known Allergies  Past Medical History:  Diagnosis Date   Chronic tonsillitis 09/2014   Exercise-induced asthma    no current med.   Spine misalignment    due to history of fx. 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   Social Drivers of Health   Financial Resource Strain: Medium Risk (08/16/2023)   Received from [facility]   Overall Financial Resource Strain (CARDIA)    Difficulty of Paying Living Expenses: Somewhat hard  Food Insecurity: No Food Insecurity (12/25/2023)   Hunger Vital Sign    Worried About Running Out of Food in the Last Year: Never true    Ran Out of Food in the Last Year: Never true  Transportation Needs: No Transportation Needs (12/25/2023)   PRAPARE - Administrator, Civil Service (Medical): No    Lack of Transportation (Non-Medical): No  Physical Activity: Sufficiently Active (08/16/2023)   Received from [facility]   Exercise Vital Sign    On average, how many days per week do you engage in moderate to strenuous exercise (like a brisk walk)?: 4 days    On average, how many minutes do you engage in exercise at this level?: 60 min  Stress: Stress Concern Present (08/16/2023)   Received from [facility] - Occupational Stress Questionnaire    Feeling of Stress : To some extent  Social Connections: Moderately Integrated (08/16/2023)   Received from [facility]   Social Network    How would you rate your social network (family, work, friends)?: Adequate participation with social networks  Intimate Partner Violence: Not At Risk (12/25/2023)   Humiliation, Afraid, Rape, and Kick questionnaire    Fear of Current or Ex-Partner: No    Emotionally Abused: No    Physically Abused: No    Sexually Abused: No    Past Medical History, Surgical history, Social history, and Family history were reviewed and updated as appropriate.   Please see review of systems for further details on the patient's review from today.    Objective:   Physical Exam:  There were no vitals taken for this visit.  Physical Exam Constitutional:      General: He is not in acute distress. Musculoskeletal:        General: No deformity.  Neurological:     Mental Status: He is alert and oriented to person, place, and time.     Coordination: Coordination normal.  Psychiatric:        Attention and Perception: Attention and perception normal. He does not perceive auditory or visual hallucinations.        Mood and Affect: Mood normal. Mood is not anxious or depressed. Affect is not labile, blunt, angry or inappropriate.        Speech: Speech normal.        Behavior: Behavior normal.        Thought Content: Thought content normal. Thought content is not paranoid or delusional. Thought content does not include homicidal or suicidal ideation. Thought content  does not include homicidal or suicidal plan.        Cognition and Memory: Cognition and memory normal.        Judgment: Judgment normal.     Comments: Insight intact     Lab Review:     Component Value Date/Time   NA 134 (L) 12/26/2023 1333   NA 139 09/16/2021 0840   K 4.1 12/26/2023 1333   CL 98 12/26/2023 1333   CO2 24 12/26/2023 1333   GLUCOSE 186 (H) 12/26/2023 1333   BUN 15 12/26/2023 1333   BUN 10 09/16/2021 0840   CREATININE 0.94 12/26/2023 1333   CALCIUM 10.1 12/26/2023 1333   PROT 7.5 12/26/2023 1333   PROT 6.9 09/16/2021 0840   ALBUMIN 4.5 12/26/2023 1333   ALBUMIN 4.6 09/16/2021 0840   AST 151 (H) 12/26/2023 1333   ALT 156 (H) 12/26/2023 1333   ALKPHOS 62 12/26/2023 1333   BILITOT 1.4 (H) 12/26/2023 1333   BILITOT 0.3 09/16/2021 0840   GFRNONAA >60 12/26/2023 1333   GFRAA >90 03/13/2013 1123       Component Value Date/Time   WBC 3.2 (L) 12/26/2023 1333   RBC 5.18 12/26/2023 1333   HGB 16.1 12/26/2023 1333   HGB 14.7 09/16/2021 0840   HCT 47.6 12/26/2023 1333   HCT 44.9 09/16/2021 0840   PLT 220 12/26/2023 1333   PLT 351 09/16/2021 0840    MCV 91.9 12/26/2023 1333   MCV 84 09/16/2021 0840   MCH 31.1 12/26/2023 1333   MCHC 33.8 12/26/2023 1333   RDW 12.7 12/26/2023 1333   RDW 15.5 (H) 09/16/2021 0840   LYMPHSABS 0.6 (L) 12/26/2023 1333   LYMPHSABS 1.9 09/16/2021 0840   MONOABS 0.3 12/26/2023 1333   EOSABS 0.1 12/26/2023 1333   EOSABS 0.5 (H) 09/16/2021 0840   BASOSABS 0.1 12/26/2023 1333   BASOSABS 0.1 09/16/2021 0840    No results found for: POCLITH, LITHIUM   No results found for: PHENYTOIN, PHENOBARB, VALPROATE, CBMZ   .res Assessment: Plan:   Treatment Plan/Recommendations:   Plan:  PDMP reviewed  Adderall XR 20mg  at twice daily. Seroquel  200mg  at bedtime for sleep/mood  RTC 8 weeks  10 minutes spent dedicated to the care of this patient on the date of this encounter to include pre-visit review of records, ordering of medication, post visit documentation, and face-to-face time with the patient discussing ADD. Discussed continuing current medication regimen.  Patient advised to contact office with any questions, adverse effects, or acute worsening in signs and symptoms.   There are no diagnoses linked to this encounter.   Please see After Visit Summary for patient specific instructions.  Future Appointments  Date Time Provider Department Center  01/31/2024  9:00 AM Travonna Swindle Nattalie, NP CP-CP None    No orders of the defined types were placed in this encounter.     -------------------------------

## 2024-04-07 ENCOUNTER — Other Ambulatory Visit: Payer: Self-pay | Admitting: Adult Health

## 2024-04-07 ENCOUNTER — Other Ambulatory Visit: Payer: Self-pay

## 2024-04-07 ENCOUNTER — Telehealth: Payer: Self-pay | Admitting: Adult Health

## 2024-04-07 DIAGNOSIS — F902 Attention-deficit hyperactivity disorder, combined type: Secondary | ICD-10-CM

## 2024-04-07 NOTE — Telephone Encounter (Signed)
 Pt called at 1:14p requesting refill for Adderall.  He said last script was written incorrectly.  It say 60 pills at once a day but he takes 2 a day.  Pharmacy filled it, but told him to get it written the correct way.  Pls send script to    [id number] [place], [place] - [address] AT [facility] OF [address] [place] [address] Phone: [phone]  Fax: [phone]   Next appt 11/24

## 2024-04-07 NOTE — Telephone Encounter (Signed)
 Pended

## 2024-04-09 NOTE — Telephone Encounter (Signed)
 Per discussion with Tillman and Dr. Geoffry it was decided we could no longer prescribe patient Adderall. Pt was notified of this.

## 2024-04-09 NOTE — Telephone Encounter (Signed)
 Pt called again to check on status of rf

## 2024-04-14 ENCOUNTER — Encounter: Payer: Self-pay | Admitting: Adult Health

## 2024-04-14 ENCOUNTER — Telehealth (INDEPENDENT_AMBULATORY_CARE_PROVIDER_SITE_OTHER): Admitting: Adult Health

## 2024-04-14 DIAGNOSIS — G4700 Insomnia, unspecified: Secondary | ICD-10-CM | POA: Diagnosis not present

## 2024-04-14 DIAGNOSIS — F5105 Insomnia due to other mental disorder: Secondary | ICD-10-CM

## 2024-04-14 MED ORDER — QUETIAPINE FUMARATE 200 MG PO TABS: 200.0000 mg | ORAL_TABLET | Freq: Every day | ORAL | 2 refills | Status: AC

## 2024-04-14 NOTE — Progress Notes (Signed)
 Jacob Bright [id number] Oct 26, 1990 33 y.o.  Virtual Visit via Video Note  I connected with pt @ on 04/14/24 at 11:30 AM EST by a video enabled telemedicine application and verified that I am speaking with the correct person using two identifiers.   I discussed the limitations of evaluation and management by telemedicine and the availability of in person appointments. The patient expressed understanding and agreed to proceed.  I discussed the assessment and treatment plan with the patient. The patient was provided an opportunity to ask questions and all were answered. The patient agreed with the plan and demonstrated an understanding of the instructions.   The patient was advised to call back or seek an in-person evaluation if the symptoms worsen or if the condition fails to improve as anticipated.  I provided 10 minutes of non-face-to-face time during this encounter.  The patient was located at home.  The provider was located at [facility].   [name], NP   Subjective:   Patient ID:  Jacob Bright is a 33 y.o. (DOB [birth year]) male.  Chief Complaint: No chief complaint on file.   HPI Jacob Bright presents for follow-up of insomnia.  Describes mood today as ok. Pleasant. Mood symptoms - denies depression - a little bit down, but nothing serious. Denies anxiety - recent stressors with getting an apartment. Denies panic attack. Reports irritability - it comes and goes. Reports lower interest and motivation. Denies worry, rumination and over thinking. Denies obsessive thoughts or acts. Reports mood as fluctuates - no mood swings. Stating I feel like I'm doing. Reports taking Seroquel  200mg  at bedtime. Taking medications as prescribed. Has not worked with therapist recently - Fred May. Energy levels good for work. Active, has a regular exercise routine.  Enjoys some usual interests and activities. Married, but separated. Living alone. Spending time with family and  friends. Appetite adequate. Weight gain - 210 pounds. Reports sleeping better some nights than others. Averages 8 hours. Reports focus and concentration difficulties - a little foggy. Completing tasks. Managing aspects of household. Working full time it consultant. Denies SI or HI.  Denies AH or VH. Denies self harm. Denies substance use.  Previous medication trials:   Adderall   Review of Systems:  Review of Systems  Musculoskeletal:  Negative for gait problem.  Neurological:  Negative for tremors.  Psychiatric/Behavioral:         Please refer to HPI    Medications: I have reviewed the patient's current medications.  Current Outpatient Medications  Medication Sig Dispense Refill   amphetamine -dextroamphetamine  (ADDERALL XR) 20 MG 24 hr capsule Take 1 capsule (20 mg total) by mouth 2 (two) times daily. 60 capsule 0   amphetamine -dextroamphetamine  (ADDERALL XR) 20 MG 24 hr capsule Take 1 capsule (20 mg total) by mouth daily. 60 capsule 0   QUEtiapine  (SEROQUEL ) 200 MG tablet Take 1 tablet (200 mg total) by mouth at bedtime. 30 tablet 2   No current facility-administered medications for this visit.    Medication Side Effects: None  Allergies: No Known Allergies  Past Medical History:  Diagnosis Date   Chronic tonsillitis 09/2014   Exercise-induced asthma    no current med.   Spine misalignment    due to history of fx. 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   Social Drivers of Health   Financial Resource Strain: Medium Risk (08/16/2023)   Received from [facility]   Overall Financial Resource Strain (CARDIA)    Difficulty of Paying Living Expenses: Somewhat hard  Food Insecurity: No Food Insecurity (12/25/2023)   Hunger Vital Sign    Worried About Running Out of Food in the Last Year: Never true    Ran Out of Food in the Last Year: Never true  Transportation Needs: No Transportation Needs (12/25/2023)   PRAPARE - Administrator, Civil Service (Medical): No    Lack of Transportation (Non-Medical): No  Physical Activity: Sufficiently Active (08/16/2023)   Received from [facility]   Exercise Vital Sign    On average, how many days per week do you engage in moderate to strenuous exercise (like a brisk walk)?: 4 days    On average, how many minutes do you engage in exercise at this level?: 60 min  Stress: Stress Concern Present (08/16/2023)   Received from [facility] of Occupational Health - Occupational Stress Questionnaire    Feeling of Stress : To some extent  Social Connections: Moderately Integrated (08/16/2023)   Received from [facility]   Social Network    How would you rate your social network (family, work, friends)?: Adequate participation with social networks  Intimate Partner Violence: Not At Risk (12/25/2023)   Humiliation, Afraid, Rape, and Kick questionnaire    Fear of Current or Ex-Partner: No    Emotionally Abused: No    Physically Abused: No    Sexually Abused: No    Past Medical History, Surgical history, Social history, and Family history were reviewed and updated as appropriate.   Please see review of systems for further details  on the patient's review from today.   Objective:   Physical Exam:  There were no vitals taken for this visit.  Physical Exam Constitutional:      General: He is not in acute distress. Musculoskeletal:        General: No deformity.  Neurological:     Mental Status: He is alert and oriented to person, place, and time.     Coordination: Coordination normal.  Psychiatric:        Attention and Perception: Attention and perception normal. He does not perceive auditory or visual hallucinations.        Mood and Affect: Mood normal. Mood is not anxious or depressed. Affect is not labile, blunt, angry or inappropriate.        Speech: Speech normal.        Behavior: Behavior normal.        Thought Content: Thought content normal. Thought content is not paranoid or delusional. Thought content does not  include homicidal or suicidal ideation. Thought content does not include homicidal or suicidal plan.        Cognition and Memory: Cognition and memory normal.        Judgment: Judgment normal.     Comments: Insight intact     Lab Review:     Component Value Date/Time   NA 134 (L) 12/26/2023 1333   NA 139 09/16/2021 0840   K 4.1 12/26/2023 1333   CL 98 12/26/2023 1333   CO2 24 12/26/2023 1333   GLUCOSE 186 (H) 12/26/2023 1333   BUN 15 12/26/2023 1333   BUN 10 09/16/2021 0840   CREATININE 0.94 12/26/2023 1333   CALCIUM 10.1 12/26/2023 1333   PROT 7.5 12/26/2023 1333   PROT 6.9 09/16/2021 0840   ALBUMIN 4.5 12/26/2023 1333   ALBUMIN 4.6 09/16/2021 0840   AST 151 (H) 12/26/2023 1333   ALT 156 (H) 12/26/2023 1333   ALKPHOS 62 12/26/2023 1333   BILITOT 1.4 (H) 12/26/2023 1333   BILITOT 0.3 09/16/2021 0840   GFRNONAA >60 12/26/2023 1333   GFRAA >90 03/13/2013 1123       Component Value Date/Time   WBC 3.2 (L) 12/26/2023 1333   RBC 5.18 12/26/2023 1333   HGB 16.1 12/26/2023 1333   HGB 14.7 09/16/2021 0840   HCT 47.6 12/26/2023 1333   HCT 44.9 09/16/2021 0840   PLT 220 12/26/2023  1333   PLT 351 09/16/2021 0840   MCV 91.9 12/26/2023 1333   MCV 84 09/16/2021 0840   MCH 31.1 12/26/2023 1333   MCHC 33.8 12/26/2023 1333   RDW 12.7 12/26/2023 1333   RDW 15.5 (H) 09/16/2021 0840   LYMPHSABS 0.6 (L) 12/26/2023 1333   LYMPHSABS 1.9 09/16/2021 0840   MONOABS 0.3 12/26/2023 1333   EOSABS 0.1 12/26/2023 1333   EOSABS 0.5 (H) 09/16/2021 0840   BASOSABS 0.1 12/26/2023 1333   BASOSABS 0.1 09/16/2021 0840    No results found for: POCLITH, LITHIUM   No results found for: PHENYTOIN, PHENOBARB, VALPROATE, CBMZ   .res Assessment: Plan:    Treatment Plan/Recommendations:   Plan:  PDMP reviewed  Seroquel  200mg  at bedtime for sleep  RTC 6/8 weeks  10 minutes spent dedicated to the care of this patient on the date of this encounter to include pre-visit review of records, ordering of medication, post visit documentation, and face-to-face time with the patient discussing insomnia. Discussed continuing current medication regimen.  Patient advised to contact office with any questions, adverse effects, or acute worsening in signs and symptoms. There are no diagnoses linked to this encounter.   Please see After Visit Summary for patient specific instructions.  Future Appointments  Date Time Provider Department Center  04/14/2024 11:30 AM Scottlyn Mchaney Nattalie, NP CP-CP None    No orders of the defined types were placed in this encounter.     -------------------------------
